# Patient Record
Sex: Female | Born: 1980 | Race: Black or African American | Hispanic: No | Marital: Single | Smoking: Former smoker
Health system: Southern US, Community
[De-identification: ages and names within clinical notes are randomized; demographics above are authoritative.]

## PROBLEM LIST (undated history)

## (undated) ENCOUNTER — Inpatient Hospital Stay (HOSPITAL_COMMUNITY): Payer: Self-pay

## (undated) DIAGNOSIS — I2510 Atherosclerotic heart disease of native coronary artery without angina pectoris: Secondary | ICD-10-CM

## (undated) DIAGNOSIS — Z720 Tobacco use: Secondary | ICD-10-CM

## (undated) DIAGNOSIS — D473 Essential (hemorrhagic) thrombocythemia: Secondary | ICD-10-CM

## (undated) DIAGNOSIS — Z5189 Encounter for other specified aftercare: Secondary | ICD-10-CM

## (undated) DIAGNOSIS — I214 Non-ST elevation (NSTEMI) myocardial infarction: Secondary | ICD-10-CM

## (undated) DIAGNOSIS — N920 Excessive and frequent menstruation with regular cycle: Secondary | ICD-10-CM

## (undated) DIAGNOSIS — K219 Gastro-esophageal reflux disease without esophagitis: Secondary | ICD-10-CM

## (undated) DIAGNOSIS — E669 Obesity, unspecified: Secondary | ICD-10-CM

## (undated) DIAGNOSIS — R110 Nausea: Secondary | ICD-10-CM

---

## 2000-03-20 ENCOUNTER — Emergency Department (HOSPITAL_COMMUNITY): Admission: EM | Admit: 2000-03-20 | Discharge: 2000-03-20 | Payer: Self-pay | Admitting: Emergency Medicine

## 2003-03-16 ENCOUNTER — Emergency Department (HOSPITAL_COMMUNITY): Admission: AD | Admit: 2003-03-16 | Discharge: 2003-03-17 | Payer: Self-pay | Admitting: Emergency Medicine

## 2004-02-21 ENCOUNTER — Emergency Department (HOSPITAL_COMMUNITY): Admission: EM | Admit: 2004-02-21 | Discharge: 2004-02-21 | Payer: Self-pay | Admitting: Emergency Medicine

## 2004-11-24 ENCOUNTER — Inpatient Hospital Stay (HOSPITAL_COMMUNITY): Admission: AD | Admit: 2004-11-24 | Discharge: 2004-11-24 | Payer: Self-pay | Admitting: *Deleted

## 2004-12-16 ENCOUNTER — Other Ambulatory Visit: Admission: RE | Admit: 2004-12-16 | Discharge: 2004-12-16 | Payer: Self-pay | Admitting: Obstetrics and Gynecology

## 2004-12-23 ENCOUNTER — Inpatient Hospital Stay (HOSPITAL_COMMUNITY): Admission: AD | Admit: 2004-12-23 | Discharge: 2004-12-23 | Payer: Self-pay | Admitting: Obstetrics and Gynecology

## 2005-01-17 ENCOUNTER — Inpatient Hospital Stay (HOSPITAL_COMMUNITY): Admission: AD | Admit: 2005-01-17 | Discharge: 2005-01-17 | Payer: Self-pay | Admitting: Obstetrics and Gynecology

## 2005-04-15 ENCOUNTER — Ambulatory Visit (HOSPITAL_COMMUNITY): Admission: RE | Admit: 2005-04-15 | Discharge: 2005-04-15 | Payer: Self-pay | Admitting: Obstetrics and Gynecology

## 2005-06-14 ENCOUNTER — Encounter: Admission: RE | Admit: 2005-06-14 | Discharge: 2005-06-14 | Payer: Self-pay | Admitting: Obstetrics and Gynecology

## 2005-07-07 ENCOUNTER — Ambulatory Visit (HOSPITAL_COMMUNITY): Admission: RE | Admit: 2005-07-07 | Discharge: 2005-07-07 | Payer: Self-pay | Admitting: Obstetrics and Gynecology

## 2005-07-12 ENCOUNTER — Inpatient Hospital Stay (HOSPITAL_COMMUNITY): Admission: RE | Admit: 2005-07-12 | Discharge: 2005-07-12 | Payer: Self-pay | Admitting: Obstetrics and Gynecology

## 2005-07-13 ENCOUNTER — Inpatient Hospital Stay (HOSPITAL_COMMUNITY): Admission: AD | Admit: 2005-07-13 | Discharge: 2005-07-16 | Payer: Self-pay | Admitting: Obstetrics and Gynecology

## 2005-08-23 ENCOUNTER — Inpatient Hospital Stay (HOSPITAL_COMMUNITY): Admission: AD | Admit: 2005-08-23 | Discharge: 2005-08-24 | Payer: Self-pay | Admitting: Obstetrics and Gynecology

## 2005-08-26 ENCOUNTER — Other Ambulatory Visit: Admission: RE | Admit: 2005-08-26 | Discharge: 2005-08-26 | Payer: Self-pay | Admitting: Obstetrics and Gynecology

## 2006-05-26 IMAGING — US US PELVIS COMPLETE MODIFY
1 series · 14 of 25 positions shown · non-contrast
Comparison: none

CLINICAL DATA: 24-year-old female.  Status post C-section 07/13/05 with vaginal bleeding.  
 TRANSABDOMINAL AND TRANSVAGINAL PELVIC ULTRASOUND:
TECHNIQUE: Both transabdominal and transvaginal ultrasound examinations of the pelvis were performed including evaluation of the uterus, ovaries, adnexal regions, and pelvic cul-de-sac.

[Series 1: us pelvis complete modify · 0.33mm/px · 14 of 59 slices shown]
[im 1/59]
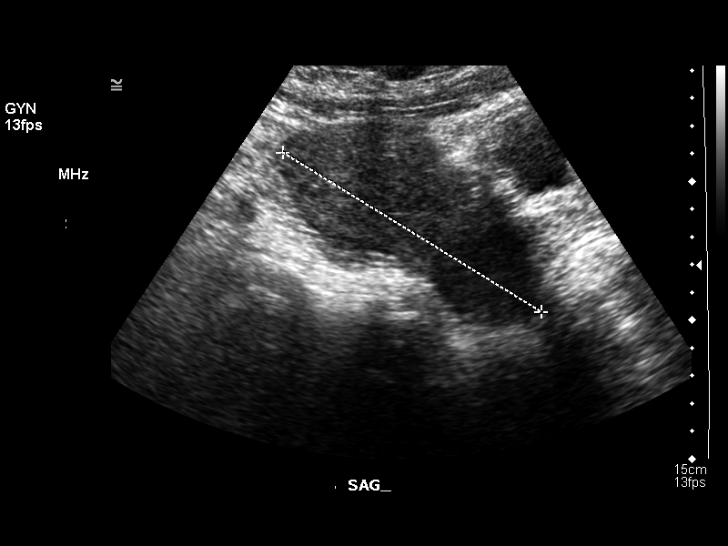
[im 5/59]
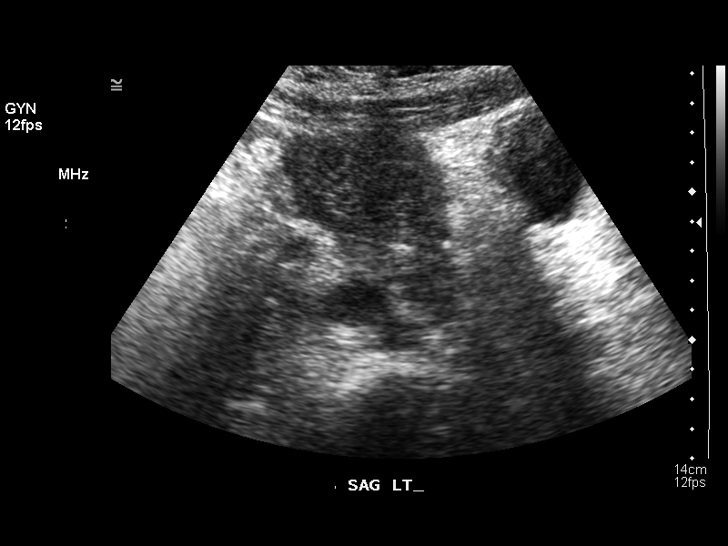
[im 10/59]
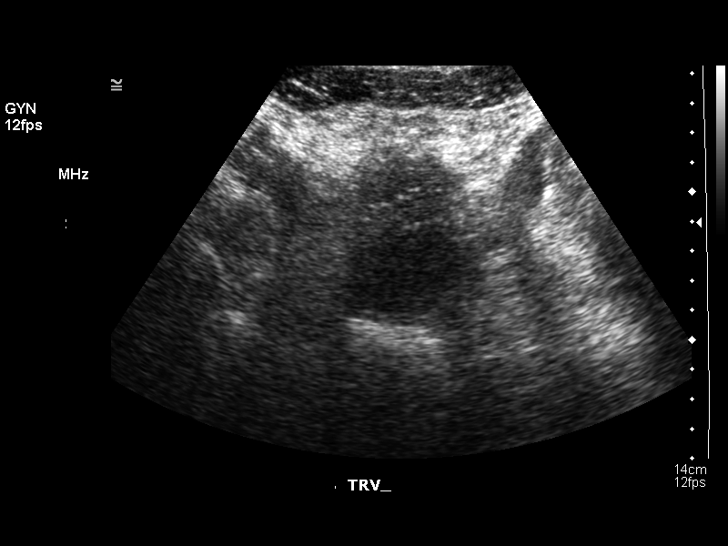
[im 15/59]
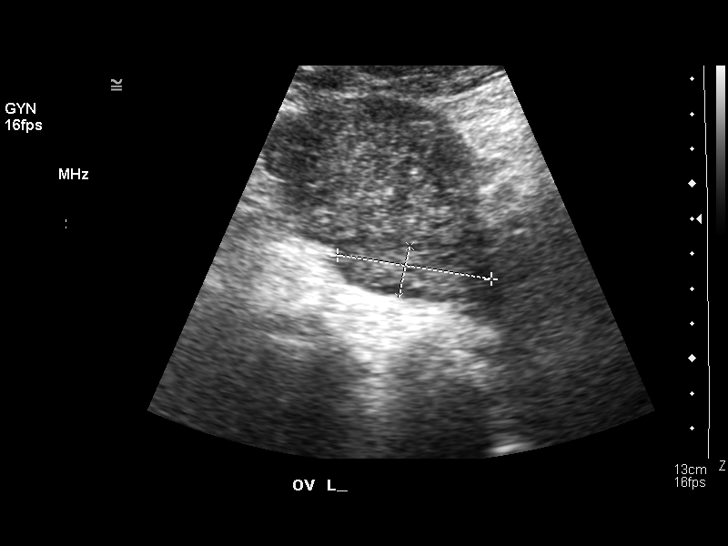
[im 20/59]
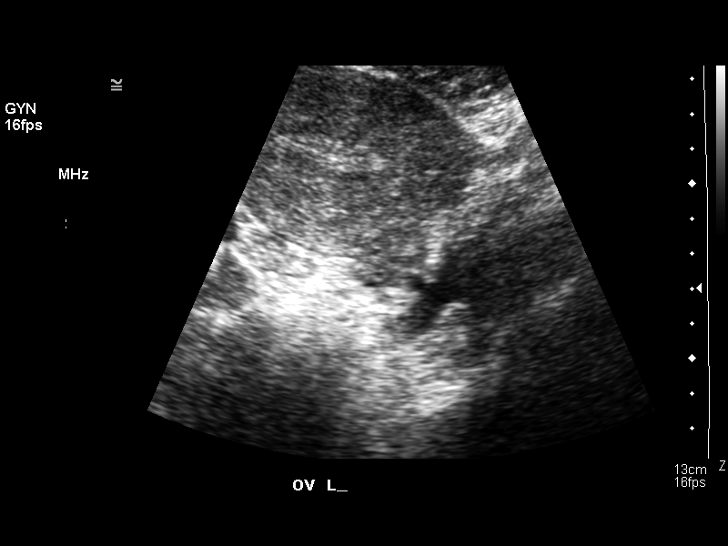
[im 22/59]
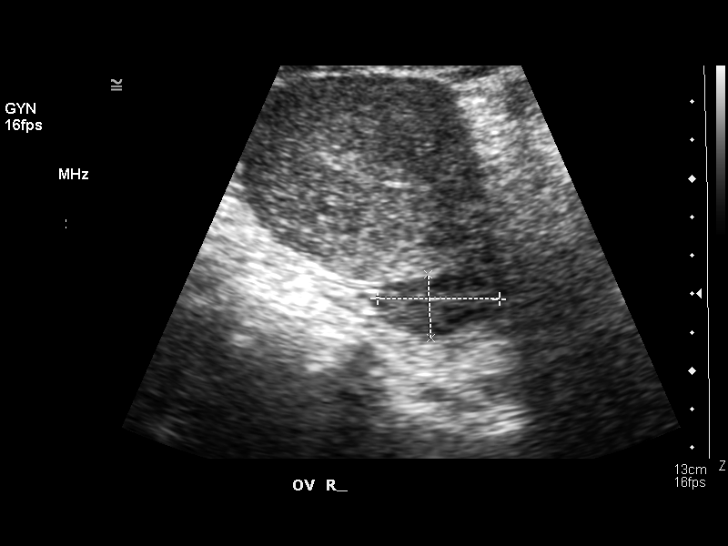
[im 27/59]
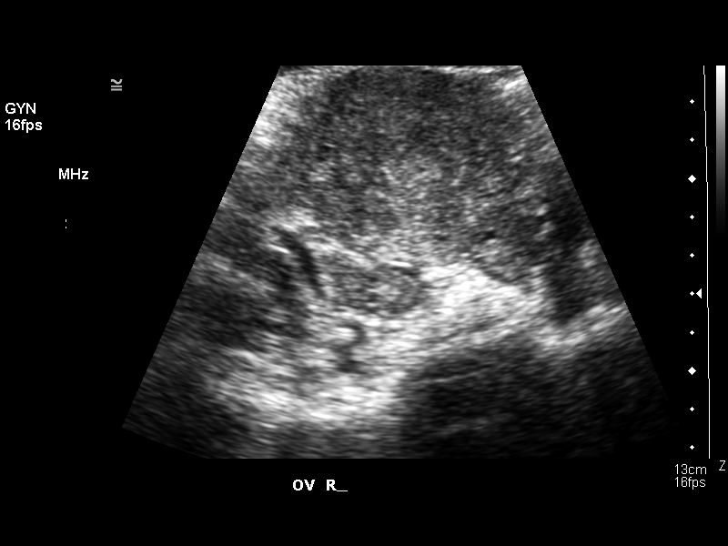
[im 32/59]
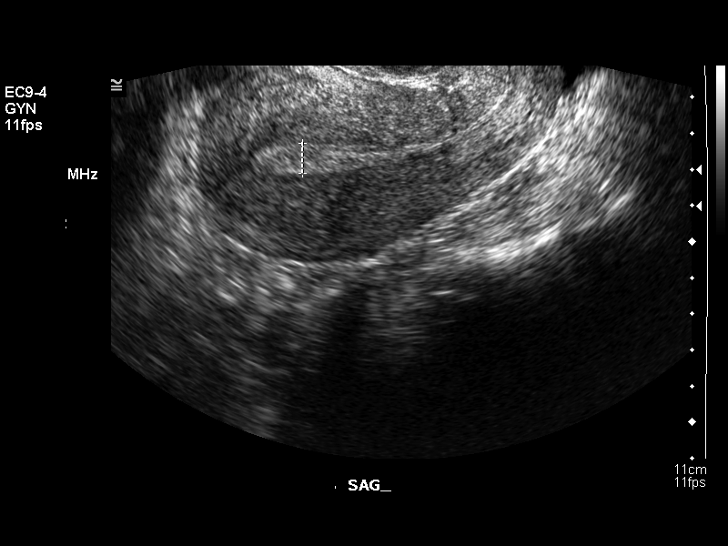
[im 37/59]
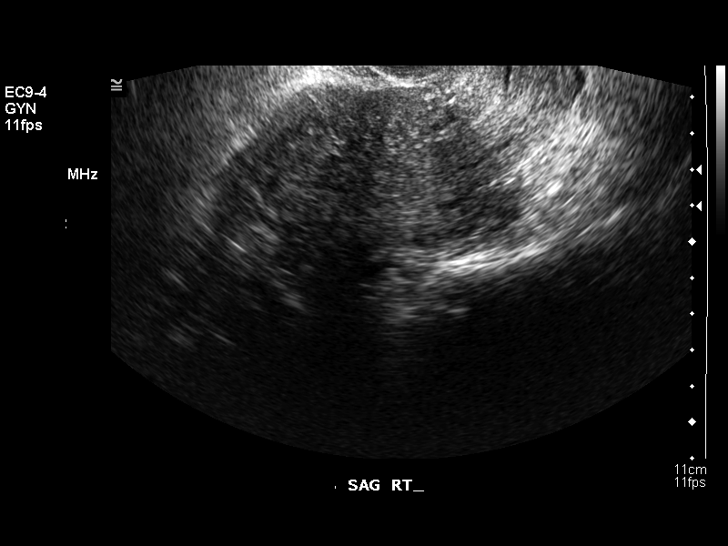
[im 39/59]
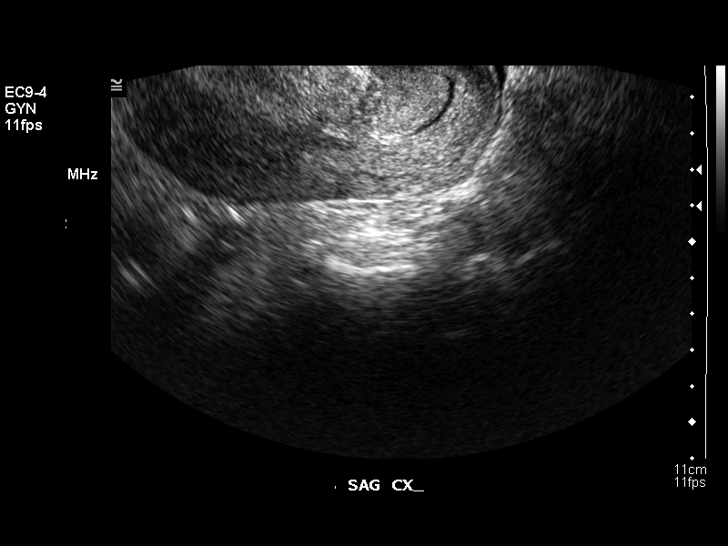
[im 44/59]
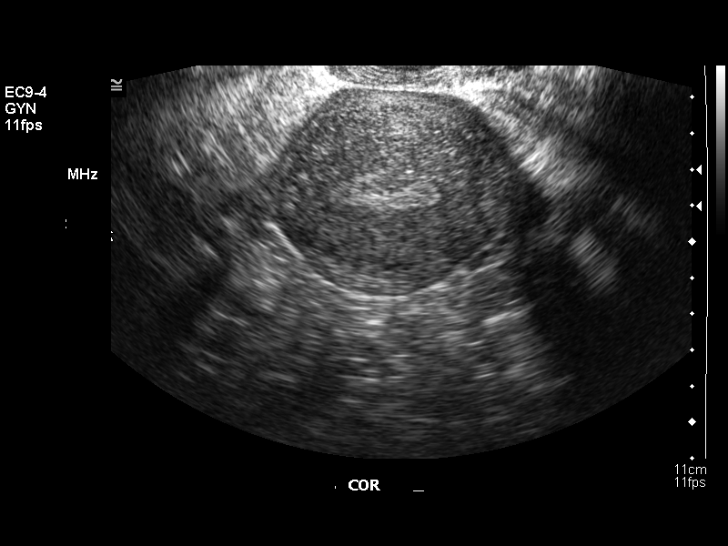
[im 49/59]
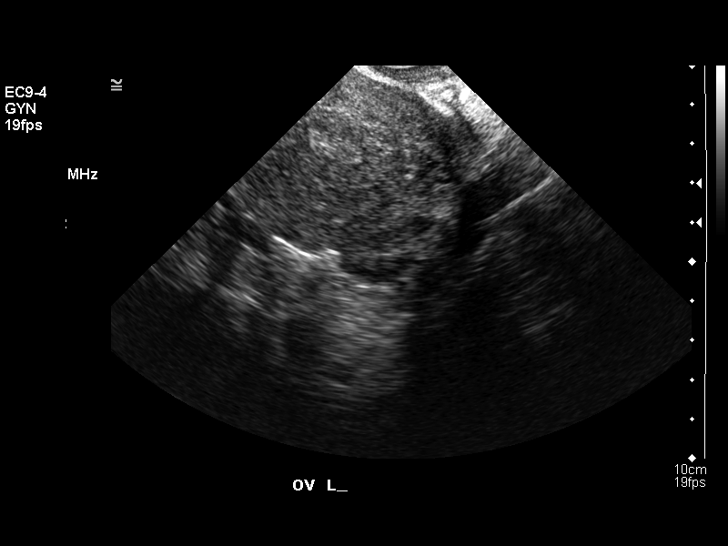
[im 54/59]
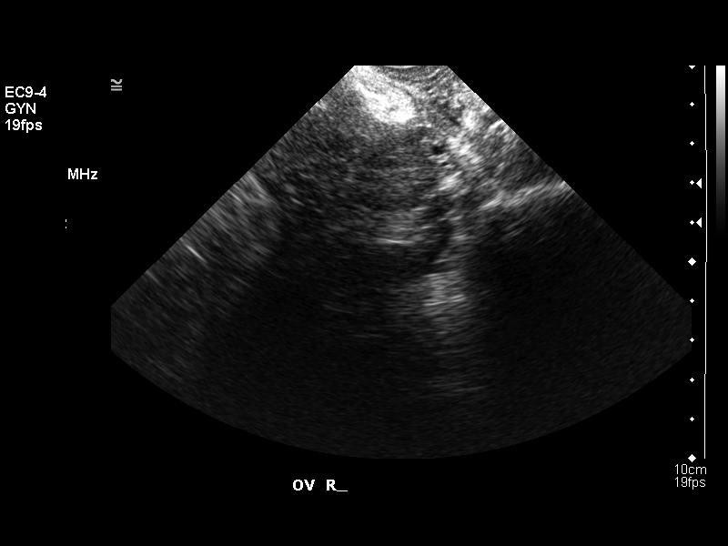
[im 59/59]
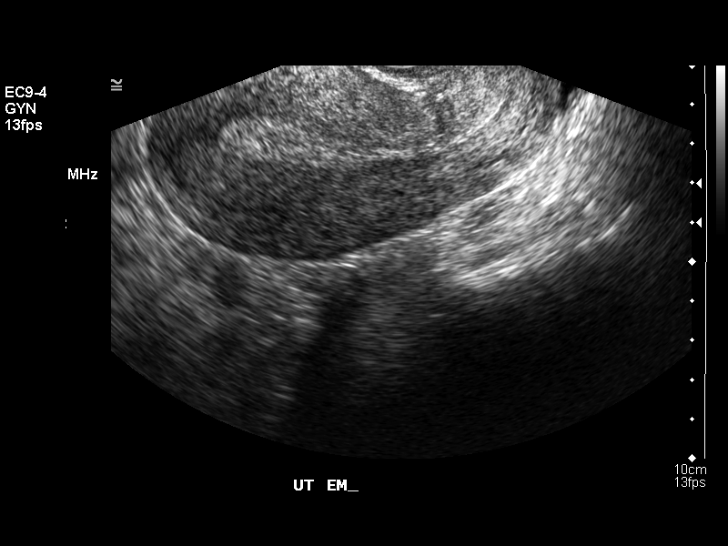

[14 of 25 positions shown; findings below may reference images not displayed]

FINDINGS: Uterus is normal in appearance measuring 10.7 x 5.6 x 6.7 cm.  Endometrium is uniform without irregularity and has a thickness of 8.3 mm.  The right ovary measures 3.3 x 1.7 x 1.5 cm.  Left ovary measures 4.8 x 1.3 x 2.2 cm.  No free fluid.
IMPRESSION: No acute finding.

## 2010-11-03 ENCOUNTER — Emergency Department (HOSPITAL_COMMUNITY)
Admission: EM | Admit: 2010-11-03 | Discharge: 2010-11-03 | Disposition: A | Discharge status: Home / Self Care | Payer: Self-pay | Attending: Emergency Medicine | Admitting: Emergency Medicine

## 2010-11-03 DIAGNOSIS — B353 Tinea pedis: Secondary | ICD-10-CM | POA: Insufficient documentation

## 2010-11-03 DIAGNOSIS — M79609 Pain in unspecified limb: Secondary | ICD-10-CM | POA: Insufficient documentation

## 2011-09-22 ENCOUNTER — Encounter (HOSPITAL_COMMUNITY): Payer: Self-pay | Admitting: *Deleted

## 2011-09-22 ENCOUNTER — Emergency Department (HOSPITAL_COMMUNITY)
Admission: EM | Admit: 2011-09-22 | Discharge: 2011-09-22 | Disposition: A | Discharge status: Home / Self Care | Payer: Self-pay | Attending: Emergency Medicine | Admitting: Emergency Medicine

## 2011-09-22 DIAGNOSIS — F17200 Nicotine dependence, unspecified, uncomplicated: Secondary | ICD-10-CM | POA: Insufficient documentation

## 2011-09-22 DIAGNOSIS — R51 Headache: Secondary | ICD-10-CM

## 2011-09-22 DIAGNOSIS — G43909 Migraine, unspecified, not intractable, without status migrainosus: Secondary | ICD-10-CM | POA: Insufficient documentation

## 2011-09-22 DIAGNOSIS — H538 Other visual disturbances: Secondary | ICD-10-CM | POA: Insufficient documentation

## 2011-09-22 DIAGNOSIS — R350 Frequency of micturition: Secondary | ICD-10-CM | POA: Insufficient documentation

## 2011-09-22 LAB — POCT I-STAT, CHEM 8
Calcium, Ion: 1.26 mmol/L (ref 1.12–1.32)
Hemoglobin: 9.2 g/dL — ABNORMAL LOW (ref 12.0–15.0)
Sodium: 141 mEq/L (ref 135–145)
TCO2: 24 mmol/L (ref 0–100)

## 2011-09-22 LAB — GLUCOSE, CAPILLARY: Glucose-Capillary: 92 mg/dL (ref 70–99)

## 2011-09-22 LAB — URINALYSIS, ROUTINE W REFLEX MICROSCOPIC
Nitrite: NEGATIVE
Specific Gravity, Urine: 1.021 (ref 1.005–1.030)
Urobilinogen, UA: 0.2 mg/dL (ref 0.0–1.0)

## 2011-09-22 LAB — PREGNANCY, URINE: Preg Test, Ur: NEGATIVE

## 2011-09-22 MED ORDER — OXYCODONE-ACETAMINOPHEN 5-325 MG PO TABS: 1.0000 | ORAL_TABLET | ORAL | Status: AC | PRN

## 2011-09-22 NOTE — ED Notes (Signed)
Pt here from home, c/o migraine x3 weeks. Hx of migraines, pt takes [facility] powder for management. States migraine has not improved, vision became blurred on 6/2, and "shift" of vision 6/4, per pt. States there is a "crawling" sensation in back of head that began on 6/2 as well

## 2011-09-22 NOTE — Discharge Instructions (Signed)
Return here or see the neurologist as needed for problems.    Headache, General, Unknown Cause The specific cause of your headache may not have been found today. There are many causes and types of headache. A few common ones are:  Tension headache.   Migraine.   Infections (examples: dental and sinus infections).   Bone and/or joint problems in the neck or jaw.   Depression.   Eye problems.  These headaches are not life threatening.  Headaches can sometimes be diagnosed by a patient history and a physical exam. Sometimes, lab and imaging studies (such as x-ray and/or CT scan) are used to rule out more serious problems. In some cases, a spinal tap (lumbar puncture) may be requested. There are many times when your exam and tests may be normal on the first visit even when there is a serious problem causing your headaches. Because of that, it is very important to follow up with your doctor or local clinic for further evaluation. FINDING OUT THE RESULTS OF TESTS  If a radiology test was performed, a radiologist will review your results.   You will be contacted by the emergency department or your physician if any test results require a change in your treatment plan.   Not all test results may be available during your visit. If your test results are not back during the visit, make an appointment with your caregiver to find out the results. Do not assume everything is normal if you have not heard from your caregiver or the medical facility. It is important for you to follow up on all of your test results.  HOME CARE INSTRUCTIONS   Keep follow-up appointments with your caregiver, or any specialist referral.   Only take over-the-counter or prescription medicines for pain, discomfort, or fever as directed by your caregiver.   Biofeedback, massage, or other relaxation techniques may be helpful.   Ice packs or heat applied to the head and neck can be used. Do this three to four times per day,  or as needed.   Call your doctor if you have any questions or concerns.   If you smoke, you should quit.  SEEK MEDICAL CARE IF:   You develop problems with medications prescribed.   You do not respond to or obtain relief from medications.   You have a change from the usual headache.   You develop nausea or vomiting.  SEEK IMMEDIATE MEDICAL CARE IF:   If your headache becomes severe.   You have an unexplained oral temperature above 102 F (38.9 C), or as your caregiver suggests.   You have a stiff neck.   You have loss of vision.   You have muscular weakness.   You have loss of muscular control.   You develop severe symptoms different from your first symptoms.   You start losing your balance or have trouble walking.   You feel faint or pass out.  MAKE SURE YOU:   Understand these instructions.   Will watch your condition.   Will get help right away if you are not doing well or get worse.  Document Released: 04/05/2005 Document Revised: 03/25/2011 Document Reviewed: 11/23/2007 [facility] Patient Information 2012 [place], [place].

## 2011-09-22 NOTE — ED Provider Notes (Signed)
History     CSN: [id number]  Arrival date & time 09/22/11  1514   First MD Initiated Contact with Patient 09/22/11 1618      Chief Complaint  Patient presents with  . Migraine  . Blurred Vision    (Consider location/radiation/quality/duration/timing/severity/associated sxs/prior treatment) HPI Comments: SHER SHAMPINE is a 31 y.o. Female who has had a persistent headache for 3 weeks. She has history of migraines. She is using BC powder without relief. She denies neck pain, weakness, dizziness, nausea, or vomiting. She has had urinary frequency, but no dysuria, or urinary incontinence. Symptoms are mild. She's able to continue working, driving, and sleeping.   Patient is a 31 y.o. female presenting with migraine. The history is provided by the patient.  Migraine    History reviewed. No pertinent past medical history.  Past Surgical History  Procedure Date  . Cesarean section     No family history on file.  History  Substance Use Topics  . Smoking status: Current Some Day Smoker -- 12 years    Types: Cigarettes  . Smokeless tobacco: Not on file  . Alcohol Use: No    OB History    Grav Para Term Preterm Abortions TAB SAB Ect Mult Living                  Review of Systems  All other systems reviewed and are negative.    Allergies  Review of patient's allergies indicates no known allergies.  Home Medications   Current Outpatient Rx  Name Route Sig Dispense Refill  . BC HEADACHE POWDER PO Oral Take 1 Package by mouth.    . OXYCODONE-ACETAMINOPHEN 5-325 MG PO TABS Oral Take 1 tablet by mouth every 4 (four) hours as needed for pain. 15 tablet 0    BP 131/89  Pulse 90  Temp(Src) 98.6 F (37 C) (Oral)  Resp 16  SpO2 100%  LMP 09/05/2011  Physical Exam  Nursing note and vitals reviewed. Constitutional: She is oriented to person, place, and time. She appears well-developed and well-nourished. No distress.  HENT:  Head: Normocephalic and atraumatic.    Eyes: Conjunctivae and EOM are normal. Pupils are equal, round, and reactive to light.  Neck: Normal range of motion and phonation normal. Neck supple. No tracheal deviation present.  Cardiovascular: Normal rate, regular rhythm and intact distal pulses.   Pulmonary/Chest: Effort normal and breath sounds normal. She exhibits no tenderness.  Abdominal: Soft. She exhibits no distension. There is no tenderness. There is no guarding.  Musculoskeletal: Normal range of motion.  Neurological: She is alert and oriented to person, place, and time. She has normal strength. She exhibits normal muscle tone.  Skin: Skin is warm and dry.  Psychiatric: She has a normal mood and affect. Her behavior is normal. Judgment and thought content normal.    ED Course  Procedures (including critical care time)  Labs Reviewed  POCT I-STAT, CHEM 8 - Abnormal; Notable for the following:    Glucose, Bld 110 (*)    Hemoglobin 9.2 (*)    HCT 27.0 (*)    All other components within normal limits  GLUCOSE, CAPILLARY  URINALYSIS, ROUTINE W REFLEX MICROSCOPIC  PREGNANCY, URINE   No results found.   1. Headache       MDM  Nonspecific headache. Doubt CVA, brain tumor, meningitis, sinusitis.Doubt metabolic instability, serious bacterial infection or impending vascular collapse; the patient is stable for discharge.  Plan: Home Medications- Percocet; Home Treatments- rest; Recommended follow  up- PCP or Neurology prn        Flint Melter, MD 09/22/11 2356

## 2011-09-22 NOTE — ED Notes (Signed)
Gave patient a sandwich and a cran grape juice.

## 2011-09-22 NOTE — ED Notes (Signed)
Pt has asked to go to her car.  It was explained that she is still a pt here and should not leave the department d/t safety issues.  Pt refused to comply saying she needs to get her school books to study.  She stated she understood this is AMA but still wants to be treated.  "I need to get some studying done and I'm sorry if you don't understand but I'm going to my car" and she walked off.

## 2013-09-20 ENCOUNTER — Ambulatory Visit: Payer: Medicaid Other | Admitting: Dietician

## 2013-12-22 ENCOUNTER — Encounter (HOSPITAL_COMMUNITY): Payer: Self-pay | Admitting: Emergency Medicine

## 2013-12-22 ENCOUNTER — Emergency Department (HOSPITAL_COMMUNITY)
Admission: EM | Admit: 2013-12-22 | Discharge: 2013-12-22 | Disposition: A | Discharge status: Home / Self Care | Payer: Medicaid Other | Attending: Emergency Medicine | Admitting: Emergency Medicine

## 2013-12-22 DIAGNOSIS — J069 Acute upper respiratory infection, unspecified: Secondary | ICD-10-CM | POA: Insufficient documentation

## 2013-12-22 DIAGNOSIS — F17200 Nicotine dependence, unspecified, uncomplicated: Secondary | ICD-10-CM | POA: Insufficient documentation

## 2013-12-22 DIAGNOSIS — J029 Acute pharyngitis, unspecified: Secondary | ICD-10-CM | POA: Insufficient documentation

## 2013-12-22 LAB — RAPID STREP SCREEN (MED CTR MEBANE ONLY): Streptococcus, Group A Screen (Direct): NEGATIVE

## 2013-12-22 MED ORDER — IBUPROFEN 600 MG PO TABS: 600.0000 mg | ORAL_TABLET | Freq: Four times a day (QID) | ORAL | Status: DC | PRN

## 2013-12-22 NOTE — ED Notes (Addendum)
Pt reports having a sore throat and a runny nose for two days. Pt states that her son was diagnosed with strep throat recently and she believes that she caught strep throat from him.

## 2013-12-22 NOTE — ED Provider Notes (Signed)
Medical screening examination/treatment/procedure(s) were performed by non-physician practitioner and as supervising physician I was immediately available for consultation/collaboration.   EKG Interpretation None       Threasa Beards, MD 12/22/13 2137

## 2013-12-22 NOTE — ED Provider Notes (Signed)
CSN: [id number]     Arrival date & time 12/22/13  2014 History   First MD Initiated Contact with Patient 12/22/13 2016    This chart was scribed for nurse practitioner Junius Creamer, NP working with Threasa Beards, MD, by Thea Alken, ED Scribe. This patient was seen in room WTR7/WTR7 and the patient's care was started at 8:19 PM. Chief Complaint  Patient presents with  . Sore Throat   The history is provided by the patient. No language interpreter was used.   Terry Morgan is a 33 y.o. female who presents to the Emergency Department complaining of sore throat 1 day ago. Pt reports she has been drinking tea and lemon juice. Pt denies fever. She reports her son was diagnosed with strep 3 days ago.   History reviewed. No pertinent past medical history. Past Surgical History  Procedure Laterality Date  . Cesarean section     No family history on file. History  Substance Use Topics  . Smoking status: Current Every Day Smoker -- 1.00 packs/day for 12 years    Types: Cigarettes  . Smokeless tobacco: Not on file  . Alcohol Use: No   OB History   Grav Para Term Preterm Abortions TAB SAB Ect Mult Living                 Review of Systems  Constitutional: Negative for fever and chills.  HENT: Positive for sore throat.   Respiratory: Negative for cough.   Neurological: Negative for dizziness and headaches.  All other systems reviewed and are negative.  Allergies  Review of patient's allergies indicates no known allergies.  Home Medications   Prior to Admission medications   Medication Sig Start Date End Date Taking? Authorizing Provider  Aspirin-Salicylamide-Caffeine (BC HEADACHE POWDER PO) Take 1 Package by mouth.    Historical Provider, MD  ibuprofen (ADVIL,MOTRIN) 600 MG tablet Take 1 tablet (600 mg total) by mouth every 6 (six) hours as needed. 12/22/13   Garald Balding, NP   BP 113/68  Pulse 77  Temp(Src) 98.6 F (37 C) (Oral)  Resp 18  SpO2 99%  LMP 12/14/2013 Physical  Exam  Nursing note and vitals reviewed. Constitutional: She is oriented to person, place, and time. She appears well-developed and well-nourished. No distress.  HENT:  Head: Normocephalic and atraumatic.  Mouth/Throat: Uvula is midline. Uvula swelling ( mild) present. Posterior oropharyngeal erythema present. No oropharyngeal exudate.  Eyes: EOM are normal. Pupils are equal, round, and reactive to light.  Neck: Neck supple.  Cardiovascular: Normal rate.   Pulmonary/Chest: Effort normal.  Musculoskeletal: Normal range of motion.  Neurological: She is alert and oriented to person, place, and time.  Skin: Skin is warm and dry.  Psychiatric: She has a normal mood and affect. Her behavior is normal.    ED Course  Procedures (including critical care time) DIAGNOSTIC STUDIES: Oxygen Saturation is 99% on RA, normal by my interpretation.    COORDINATION OF CARE: 9:33 PM- Pt advised of plan for treatment and pt agrees.  Labs Review Labs Reviewed  RAPID STREP SCREEN  CULTURE, GROUP A STREP   Results for orders placed during the hospital encounter of 12/22/13  RAPID STREP SCREEN      Result Value Ref Range   Streptococcus, Group A Screen (Direct) NEGATIVE  NEGATIVE   Imaging Review No results found.   EKG Interpretation None      MDM   Final diagnoses:  URI (upper respiratory infection)  Pharyngitis  Strep test is negative I personally performed the services described in this documentation, which was scribed in my presence. The recorded information has been reviewed and is accurate.      Garald Balding, NP 12/22/13 2135

## 2013-12-22 NOTE — Discharge Instructions (Signed)
Your strep test is negative. °

## 2013-12-24 LAB — CULTURE, GROUP A STREP

## 2014-09-11 ENCOUNTER — Other Ambulatory Visit (HOSPITAL_COMMUNITY): Payer: Self-pay

## 2014-09-11 ENCOUNTER — Inpatient Hospital Stay (HOSPITAL_COMMUNITY)
Admission: EM | Admit: 2014-09-11 | Discharge: 2014-09-16 | DRG: 247 | Disposition: A | Discharge status: Home / Self Care | Payer: Medicaid Other | Attending: Cardiovascular Disease | Admitting: Cardiovascular Disease

## 2014-09-11 ENCOUNTER — Emergency Department (HOSPITAL_COMMUNITY): Payer: Medicaid Other

## 2014-09-11 ENCOUNTER — Encounter (HOSPITAL_COMMUNITY): Payer: Self-pay | Admitting: *Deleted

## 2014-09-11 DIAGNOSIS — R1310 Dysphagia, unspecified: Secondary | ICD-10-CM

## 2014-09-11 DIAGNOSIS — D75839 Thrombocytosis, unspecified: Secondary | ICD-10-CM | POA: Diagnosis present

## 2014-09-11 DIAGNOSIS — F17210 Nicotine dependence, cigarettes, uncomplicated: Secondary | ICD-10-CM | POA: Diagnosis present

## 2014-09-11 DIAGNOSIS — R112 Nausea with vomiting, unspecified: Secondary | ICD-10-CM | POA: Diagnosis present

## 2014-09-11 DIAGNOSIS — Z6841 Body Mass Index (BMI) 40.0 and over, adult: Secondary | ICD-10-CM

## 2014-09-11 DIAGNOSIS — E669 Obesity, unspecified: Secondary | ICD-10-CM | POA: Diagnosis present

## 2014-09-11 DIAGNOSIS — D649 Anemia, unspecified: Secondary | ICD-10-CM | POA: Diagnosis present

## 2014-09-11 DIAGNOSIS — Z720 Tobacco use: Secondary | ICD-10-CM | POA: Diagnosis present

## 2014-09-11 DIAGNOSIS — I2510 Atherosclerotic heart disease of native coronary artery without angina pectoris: Secondary | ICD-10-CM | POA: Diagnosis present

## 2014-09-11 DIAGNOSIS — K5900 Constipation, unspecified: Secondary | ICD-10-CM | POA: Diagnosis present

## 2014-09-11 DIAGNOSIS — D509 Iron deficiency anemia, unspecified: Secondary | ICD-10-CM | POA: Diagnosis present

## 2014-09-11 DIAGNOSIS — I2582 Chronic total occlusion of coronary artery: Secondary | ICD-10-CM | POA: Diagnosis present

## 2014-09-11 DIAGNOSIS — I214 Non-ST elevation (NSTEMI) myocardial infarction: Secondary | ICD-10-CM

## 2014-09-11 DIAGNOSIS — N920 Excessive and frequent menstruation with regular cycle: Secondary | ICD-10-CM | POA: Diagnosis present

## 2014-09-11 DIAGNOSIS — D473 Essential (hemorrhagic) thrombocythemia: Secondary | ICD-10-CM | POA: Diagnosis present

## 2014-09-11 DIAGNOSIS — R739 Hyperglycemia, unspecified: Secondary | ICD-10-CM | POA: Diagnosis present

## 2014-09-11 DIAGNOSIS — R079 Chest pain, unspecified: Secondary | ICD-10-CM | POA: Diagnosis present

## 2014-09-11 DIAGNOSIS — K219 Gastro-esophageal reflux disease without esophagitis: Secondary | ICD-10-CM | POA: Diagnosis present

## 2014-09-11 DIAGNOSIS — R001 Bradycardia, unspecified: Secondary | ICD-10-CM | POA: Diagnosis present

## 2014-09-11 DIAGNOSIS — R110 Nausea: Secondary | ICD-10-CM

## 2014-09-11 HISTORY — DX: Essential (hemorrhagic) thrombocythemia: D47.3

## 2014-09-11 HISTORY — DX: Tobacco use: Z72.0

## 2014-09-11 HISTORY — DX: Excessive and frequent menstruation with regular cycle: N92.0

## 2014-09-11 HISTORY — DX: Atherosclerotic heart disease of native coronary artery without angina pectoris: I25.10

## 2014-09-11 HISTORY — DX: Gastro-esophageal reflux disease without esophagitis: K21.9

## 2014-09-11 HISTORY — DX: Non-ST elevation (NSTEMI) myocardial infarction: I21.4

## 2014-09-11 HISTORY — DX: Encounter for other specified aftercare: Z51.89

## 2014-09-11 HISTORY — DX: Thrombocytosis, unspecified: D75.839

## 2014-09-11 HISTORY — DX: Nausea: R11.0

## 2014-09-11 HISTORY — DX: Obesity, unspecified: E66.9

## 2014-09-11 LAB — BRAIN NATRIURETIC PEPTIDE: B Natriuretic Peptide: 16.5 pg/mL (ref 0.0–100.0)

## 2014-09-11 LAB — CBC WITH DIFFERENTIAL/PLATELET
BASOS PCT: 1 % (ref 0–1)
Basophils Absolute: 0.1 10*3/uL (ref 0.0–0.1)
EOS PCT: 5 % (ref 0–5)
Eosinophils Absolute: 0.4 10*3/uL (ref 0.0–0.7)
HCT: 31.7 % — ABNORMAL LOW (ref 36.0–46.0)
HEMOGLOBIN: 9.4 g/dL — AB (ref 12.0–15.0)
LYMPHS PCT: 33 % (ref 12–46)
Lymphs Abs: 2.6 10*3/uL (ref 0.7–4.0)
MCH: 20.3 pg — ABNORMAL LOW (ref 26.0–34.0)
MCHC: 29.7 g/dL — AB (ref 30.0–36.0)
MCV: 68.5 fL — ABNORMAL LOW (ref 78.0–100.0)
MONO ABS: 0.6 10*3/uL (ref 0.1–1.0)
MONOS PCT: 8 % (ref 3–12)
NEUTROS PCT: 53 % (ref 43–77)
Neutro Abs: 4.3 10*3/uL (ref 1.7–7.7)
Platelets: 557 10*3/uL — ABNORMAL HIGH (ref 150–400)
RBC: 4.63 MIL/uL (ref 3.87–5.11)
RDW: 24.8 % — ABNORMAL HIGH (ref 11.5–15.5)
WBC: 8 10*3/uL (ref 4.0–10.5)

## 2014-09-11 LAB — BASIC METABOLIC PANEL
ANION GAP: 9 (ref 5–15)
BUN: 8 mg/dL (ref 6–20)
CO2: 22 mmol/L (ref 22–32)
CREATININE: 0.7 mg/dL (ref 0.44–1.00)
Calcium: 9.2 mg/dL (ref 8.9–10.3)
Chloride: 104 mmol/L (ref 101–111)
GFR calc Af Amer: >60 mL/min (ref >60)
Glucose, Bld: 100 mg/dL — ABNORMAL HIGH (ref 65–99)
POTASSIUM: 4 mmol/L (ref 3.5–5.1)
SODIUM: 135 mmol/L (ref 135–145)

## 2014-09-11 LAB — TROPONIN I: Troponin I: 1 ng/mL (ref <0.031)

## 2014-09-11 LAB — I-STAT BETA HCG BLOOD, ED (MC, WL, AP ONLY): I-stat hCG, quantitative: <5 m[IU]/mL (ref <5)

## 2014-09-11 MED ORDER — SODIUM CHLORIDE 0.9 % IV SOLN
Freq: Once | INTRAVENOUS | Status: AC
  Administered 2014-09-11: 20:00:00 via INTRAVENOUS

## 2014-09-11 MED ORDER — ONDANSETRON HCL 4 MG/2ML IJ SOLN
4.0000 mg | Freq: Once | INTRAMUSCULAR | Status: DC
  Filled 2014-09-11: qty 2

## 2014-09-11 MED ORDER — ASPIRIN 81 MG PO CHEW
324.0000 mg | CHEWABLE_TABLET | Freq: Once | ORAL | Status: AC
  Administered 2014-09-11: 324 mg via ORAL
  Filled 2014-09-11: qty 4

## 2014-09-11 MED ORDER — ONDANSETRON HCL 4 MG/2ML IJ SOLN
4.0000 mg | Freq: Once | INTRAMUSCULAR | Status: AC
  Administered 2014-09-11: 4 mg via INTRAVENOUS
  Filled 2014-09-11: qty 2

## 2014-09-11 MED ORDER — IOHEXOL 350 MG/ML SOLN
100.0000 mL | Freq: Once | INTRAVENOUS | Status: AC | PRN
  Administered 2014-09-11: 100 mL via INTRAVENOUS

## 2014-09-11 NOTE — ED Notes (Signed)
Cardiology at bedside.

## 2014-09-11 NOTE — ED Provider Notes (Signed)
CSN: [id number]     Arrival date & time 09/11/14  1829 History   First MD Initiated Contact with Patient 09/11/14 1910     Chief Complaint  Patient presents with  . Chest Pain     (Consider location/radiation/quality/duration/timing/severity/associated sxs/prior Treatment) HPI  Terry Morgan is a 34 y.o. female with PMH of smoking, obesity presenting with centralized chest pain intermittently for the past 2 months that described as sharp, tightness without radiation. It started at 1 PM today and resolved and returned at 4 PM and has been constant. Patient reports associated shortness of breath and nausea but no emesis or diaphoresis. Patient stated this is similar to when she had received a blood transfusion for anemia after cesarean. Patient denies any hematemesis or hematochezia. Patient does reports history of heavy bleeding saturating 1-2 pads per hour with 5-7 days menses every month. Patient denies history of hypertension, dyslipidemia, diabetes. No significant family history of cardiac problems. Patient never had stress test or cardiac catheterization. Pt denies history of DVT, PE, recent surgery or trauma, malignancy, hemoptysis, exogenous estrogen use, unilateral leg swelling or tenderness, immobilization.    Past Medical History  Diagnosis Date  . Blood transfusion without reported diagnosis    Past Surgical History  Procedure Laterality Date  . Cesarean section     History reviewed. No pertinent family history. History  Substance Use Topics  . Smoking status: Current Every Day Smoker -- 1.00 packs/day for 12 years    Types: Cigarettes  . Smokeless tobacco: Not on file  . Alcohol Use: No   OB History    No data available     Review of Systems 10 Systems reviewed and are negative for acute change except as noted in the HPI.    Allergies  Review of patient's allergies indicates no known allergies.  Home Medications   Prior to Admission medications   Medication  Sig Start Date End Date Taking? Authorizing Provider  ibuprofen (ADVIL,MOTRIN) 600 MG tablet Take 1 tablet (600 mg total) by mouth every 6 (six) hours as needed. Patient not taking: Reported on 09/11/2014 12/22/13   Junius Creamer, NP   BP 105/51 mmHg  Pulse 61  Temp(Src) 98.4 F (36.9 C)  Resp 22  SpO2 100%  LMP 09/04/2014 Physical Exam  Constitutional: She appears well-developed and well-nourished. No distress.  HENT:  Head: Normocephalic and atraumatic.  Eyes: Conjunctivae and EOM are normal. Right eye exhibits no discharge. Left eye exhibits no discharge.  Neck: No JVD present.  Cardiovascular: Normal rate and regular rhythm.   No leg swelling or tenderness. Negative Homan's sign.  Pulmonary/Chest: Effort normal and breath sounds normal. No respiratory distress. She has no wheezes.  Abdominal: Soft. Bowel sounds are normal. She exhibits no distension. There is no tenderness.  Neurological: She is alert. She exhibits normal muscle tone. Coordination normal.  Skin: Skin is warm and dry. She is not diaphoretic.  Nursing note and vitals reviewed.   ED Course  Procedures (including critical care time) Labs Review Labs Reviewed  TROPONIN I - Abnormal; Notable for the following:    Troponin I 1.00 (*)    All other components within normal limits  BASIC METABOLIC PANEL - Abnormal; Notable for the following:    Glucose, Bld 100 (*)    All other components within normal limits  CBC WITH DIFFERENTIAL/PLATELET - Abnormal; Notable for the following:    Hemoglobin 9.4 (*)    HCT 31.7 (*)    MCV 68.5 (*)  MCH 20.3 (*)    MCHC 29.7 (*)    RDW 24.8 (*)    Platelets 557 (*)    All other components within normal limits  BRAIN NATRIURETIC PEPTIDE  I-STAT BETA HCG BLOOD, ED ([facility], [facility], [facility] ONLY)    Imaging Review Dg Chest 2 View  09/11/2014   CLINICAL DATA:  Centralized chest pain shortness of breath for 1 week. History of smoking.  EXAM: CHEST  2 VIEW  COMPARISON:  None.  FINDINGS:  Borderline enlarged cardiac silhouette. Normal mediastinal contours. Minimal bilateral infrahilar heterogeneous opacities favored to represent atelectasis. No discrete focal airspace opacities. There is mild diffuse slightly nodular thickening of the pulmonary interstitium. There is minimal pleural parenchymal thickening about the right minor fissure. No pleural effusion or pneumothorax. No evidence of edema. No acute osseus abnormalities.  IMPRESSION: Findings suggestive of airways disease / bronchitis. No focal airspace opacities to suggest pneumonia.   Electronically Signed   By: Sandi Mariscal M.D.   On: 09/11/2014 20:42   Ct Angio Chest Pe W/cm &/or Wo Cm  09/11/2014   CLINICAL DATA:  Initial UA shin for acute chest tightness, shortness of breath.  EXAM: CT ANGIOGRAPHY CHEST WITH CONTRAST  TECHNIQUE: Multidetector CT imaging of the chest was performed using the standard protocol during bolus administration of intravenous contrast. Multiplanar CT image reconstructions and MIPs were obtained to evaluate the vascular anatomy.  CONTRAST:  152mL OMNIPAQUE IOHEXOL 350 MG/ML SOLN  COMPARISON:  Prior radiograph from earlier the same day  FINDINGS: Visualized thyroid gland is within normal limits. Left thyroid lobe is extends inferiorly towards the substernal region. No pathologically enlarged mediastinal, hilar, or axillary lymph nodes identified.  Intrathoracic aorta of normal caliber and appearance. Great vessels within normal limits.  Heart size normal.  No pericardial effusion.  Pulmonary arterial tree is adequately opacified for evaluation. No filling defect to suggest acute pulmonary embolism identified. Re-formatted imaging confirms these findings.  Lungs are clear without focal infiltrate or pulmonary edema. No pleural effusion. No pneumothorax. No worrisome pulmonary nodule or mass. Mild subsegmental atelectasis seen dependently within the lower lobes bilaterally.  Visualized portions of the upper abdomen are  unremarkable.  No acute osseous abnormality. No worrisome lytic or blastic osseous lesions.  IMPRESSION: 1. No CT evidence for acute pulmonary embolism. 2. No other acute cardiopulmonary process identified.   Electronically Signed   By: Jeannine Boga M.D.   On: 09/11/2014 22:37     EKG Interpretation None       ED ECG REPORT   Date: 09/11/2014  Rate: 60  Rhythm: normal sinus rhythm  QRS Axis: normal  Intervals: PR interval 124, QT 412  ST/T Wave abnormalities: nonspecific T wave changes  Conduction Disutrbances:none  Narrative Interpretation: NSR and nonspecific T wave abnormality  Old EKG Reviewed: none available  I have personally reviewed the EKG tracing and agree with the computerized printout as noted.   Meds given in ED:  Medications  ondansetron (ZOFRAN) injection 4 mg (4 mg Intravenous Not Given 09/11/14 2312)  aspirin chewable tablet 324 mg (324 mg Oral Given 09/11/14 2006)  ondansetron (ZOFRAN) injection 4 mg (4 mg Intravenous Given 09/11/14 1945)  0.9 %  sodium chloride infusion ( Intravenous Stopped 09/11/14 2312)  iohexol (OMNIPAQUE) 350 MG/ML injection 100 mL (100 mLs Intravenous Contrast Given 09/11/14 2158)    New Prescriptions   No medications on file      MDM   Final diagnoses:  NSTEMI (non-ST elevated myocardial infarction)  Patient presenting with intermittent chest pain that became constant today at 4 PM with associated shortness of breath. Patient with initial hypotension and bradycardia. Troponin of 1. CTA ordered to evaluate for PE was negative. No other acute cardiopulmonary process identified. Concern for an NSTEMI. Consult to Cardiology. Spoke with Dr. Jules Husbands who agrees to evaluate the pt with plan for admission for further ACS workup.  Discussed return precautions with patient. Discussed all results and patient verbalizes understanding and agrees with plan.  This is a shared patient. This patient was discussed with the physician who  saw and evaluated the patient and agrees with the plan.    Al Corpus, PA-C 09/13/14 1129  Charlesetta Shanks, MD 09/14/14 [phone]

## 2014-09-11 NOTE — H&P (Signed)
Terry Morgan is an 34 y.o. female.    Chief Complaint: chest pain Primary Cardiologist: new HPI: [name] is a 34 yo woman with PMH of heavy menses requiring transfusion previously, tobacco use ~ 1ppd x > 12 years, obesity who presents with chest pain. She states the pain has been intermittent for the past 2 months with more frequently episodes over the last week and she characterizes the pain as substernal with sharp and tight components. She does endorse some associated shortness of breath and mild nausea. She has had similar symptoms before requiring transfusion when she had a caesarean section. She tells me about a year ago she took phentermine for weight loss and had some chest pain after taking the medication - she stopped it after approximately 2 weeks - this was prescribed by her PCP. She has stable heavy menses ~ once per month lasting 7-8 days. She had mildly low blood pressure on arrival but poor PO intake and some nausea earlier today. She denies recent bleeding or sick contacts. No trauma, no falls. She has passed out previously. She says she's been told she's had a heart murmur but no echocardiogram performed. She is chest pain free when I evaluate her. No recent trauma. In the ER she had an elevated troponin and CTA was performed and negative for PE.     Past Medical History  Diagnosis Date  . Blood transfusion without reported diagnosis     Past Surgical History  Procedure Laterality Date  . Cesarean section      History reviewed. No pertinent family history. Social History:  reports that she has been smoking Cigarettes.  She has a 12 pack-year smoking history. She does not have any smokeless tobacco history on file. She reports that she does not drink alcohol or use illicit drugs.  Allergies: No Known Allergies   (Not in a hospital admission)  Results for orders placed or performed during the hospital encounter of 09/11/14 (from the past 48 hour(s))  Troponin  I     Status: Abnormal   Collection Time: 09/11/14  6:50 PM  Result Value Ref Range   Troponin I 1.00 (HH) <0.031 ng/mL    Comment:        POSSIBLE MYOCARDIAL ISCHEMIA. SERIAL TESTING RECOMMENDED. REPEATED TO VERIFY CRITICAL RESULT CALLED TO, READ BACK BY AND VERIFIED WITH: [facility] 2002 09/11/2014 WBOND   Basic metabolic panel     Status: Abnormal   Collection Time: 09/11/14  6:50 PM  Result Value Ref Range   Sodium 135 135 - 145 mmol/L   Potassium 4.0 3.5 - 5.1 mmol/L   Chloride 104 101 - 111 mmol/L   CO2 22 22 - 32 mmol/L   Glucose, Bld 100 (H) 65 - 99 mg/dL   BUN 8 6 - 20 mg/dL   Creatinine, Ser 0.70 0.44 - 1.00 mg/dL   Calcium 9.2 8.9 - 10.3 mg/dL   GFR calc non Af Amer >60 >60 mL/min   GFR calc Af Amer >60 >60 mL/min    Comment: (NOTE) The eGFR has been calculated using the CKD EPI equation. This calculation has not been validated in all clinical situations. eGFR's persistently <60 mL/min signify possible Chronic Kidney Disease.    Anion gap 9 5 - 15  BNP (order ONLY if patient complains of dyspnea/SOB AND you have documented it for THIS visit)     Status: None   Collection Time: 09/11/14  6:51 PM  Result Value Ref Range  B Natriuretic Peptide 16.5 0.0 - 100.0 pg/mL  CBC with Differential     Status: Abnormal   Collection Time: 09/11/14  6:51 PM  Result Value Ref Range   WBC 8.0 4.0 - 10.5 K/uL   RBC 4.63 3.87 - 5.11 MIL/uL   Hemoglobin 9.4 (L) 12.0 - 15.0 g/dL   HCT 31.7 (L) 36.0 - 46.0 %   MCV 68.5 (L) 78.0 - 100.0 fL   MCH 20.3 (L) 26.0 - 34.0 pg   MCHC 29.7 (L) 30.0 - 36.0 g/dL   RDW 24.8 (H) 11.5 - 15.5 %   Platelets 557 (H) 150 - 400 K/uL   Neutrophils Relative % 53 43 - 77 %   Lymphocytes Relative 33 12 - 46 %   Monocytes Relative 8 3 - 12 %   Eosinophils Relative 5 0 - 5 %   Basophils Relative 1 0 - 1 %   Neutro Abs 4.3 1.7 - 7.7 K/uL   Lymphs Abs 2.6 0.7 - 4.0 K/uL   Monocytes Absolute 0.6 0.1 - 1.0 K/uL   Eosinophils Absolute 0.4 0.0 - 0.7  K/uL   Basophils Absolute 0.1 0.0 - 0.1 K/uL   RBC Morphology POLYCHROMASIA PRESENT    Smear Review LARGE PLATELETS PRESENT     Comment: PLATELET CLUMPS NOTED ON SMEAR, COUNT APPEARS INCREASED  I-Stat Beta hCG blood, ED ([facility], [facility], [facility] only)     Status: None   Collection Time: 09/11/14  8:58 PM  Result Value Ref Range   I-stat hCG, quantitative <5.0 <5 mIU/mL   Comment 3            Comment:   GEST. AGE      CONC.  (mIU/mL)   <=1 WEEK        5 - 50     2 WEEKS       50 - 500     3 WEEKS       100 - 10,000     4 WEEKS     1,000 - 30,000        FEMALE AND NON-PREGNANT FEMALE:     LESS THAN 5 mIU/mL    Dg Chest 2 View  09/11/2014   CLINICAL DATA:  Centralized chest pain shortness of breath for 1 week. History of smoking.  EXAM: CHEST  2 VIEW  COMPARISON:  None.  FINDINGS: Borderline enlarged cardiac silhouette. Normal mediastinal contours. Minimal bilateral infrahilar heterogeneous opacities favored to represent atelectasis. No discrete focal airspace opacities. There is mild diffuse slightly nodular thickening of the pulmonary interstitium. There is minimal pleural parenchymal thickening about the right minor fissure. No pleural effusion or pneumothorax. No evidence of edema. No acute osseus abnormalities.  IMPRESSION: Findings suggestive of airways disease / bronchitis. No focal airspace opacities to suggest pneumonia.   Electronically Signed   By: Sandi Mariscal M.D.   On: 09/11/2014 20:42   Ct Angio Chest Pe W/cm &/or Wo Cm  09/11/2014   CLINICAL DATA:  Initial UA shin for acute chest tightness, shortness of breath.  EXAM: CT ANGIOGRAPHY CHEST WITH CONTRAST  TECHNIQUE: Multidetector CT imaging of the chest was performed using the standard protocol during bolus administration of intravenous contrast. Multiplanar CT image reconstructions and MIPs were obtained to evaluate the vascular anatomy.  CONTRAST:  165m OMNIPAQUE IOHEXOL 350 MG/ML SOLN  COMPARISON:  Prior radiograph from earlier the same day   FINDINGS: Visualized thyroid gland is within normal limits. Left thyroid lobe is extends inferiorly towards the  substernal region. No pathologically enlarged mediastinal, hilar, or axillary lymph nodes identified.  Intrathoracic aorta of normal caliber and appearance. Great vessels within normal limits.  Heart size normal.  No pericardial effusion.  Pulmonary arterial tree is adequately opacified for evaluation. No filling defect to suggest acute pulmonary embolism identified. Re-formatted imaging confirms these findings.  Lungs are clear without focal infiltrate or pulmonary edema. No pleural effusion. No pneumothorax. No worrisome pulmonary nodule or mass. Mild subsegmental atelectasis seen dependently within the lower lobes bilaterally.  Visualized portions of the upper abdomen are unremarkable.  No acute osseous abnormality. No worrisome lytic or blastic osseous lesions.  IMPRESSION: 1. No CT evidence for acute pulmonary embolism. 2. No other acute cardiopulmonary process identified.   Electronically Signed   By: Jeannine Boga M.D.   On: 09/11/2014 22:37    Review of Systems  Constitutional: Negative for fever, chills and weight loss.  HENT: Negative for ear discharge.   Eyes: Negative for double vision and photophobia.  Respiratory: Negative for cough and hemoptysis.   Cardiovascular: Positive for chest pain, palpitations and leg swelling. Negative for orthopnea and claudication.  Gastrointestinal: Positive for nausea. Negative for abdominal pain and diarrhea.  Genitourinary: Negative for dysuria and hematuria.  Musculoskeletal: Negative for myalgias and neck pain.  Skin: Negative for rash.  Neurological: Negative for tingling, tremors, sensory change and headaches.  Endo/Heme/Allergies: Negative for polydipsia. Does not bruise/bleed easily.  Psychiatric/Behavioral: Negative for depression, suicidal ideas and hallucinations. The patient is not nervous/anxious.     Blood pressure 120/81,  pulse 67, temperature 98.4 F (36.9 C), resp. rate 22, last menstrual period 09/04/2014, SpO2 99 %. Physical Exam  Nursing note and vitals reviewed. Constitutional: She is oriented to person, place, and time. She appears well-developed and well-nourished. No distress.  HENT:  Head: Normocephalic and atraumatic.  Nose: Nose normal.  Mouth/Throat: Oropharynx is clear and moist. No oropharyngeal exudate.  Eyes: Conjunctivae and EOM are normal. Pupils are equal, Morgan, and reactive to light. No scleral icterus.  Neck: Normal range of motion. Neck supple. JVD present. No tracheal deviation present.  JVP midneck, slight HJR  Cardiovascular: Normal rate, regular rhythm, normal heart sounds and intact distal pulses.  Exam reveals no gallop.   No murmur heard. bradycardic  Respiratory: Effort normal and breath sounds normal. No respiratory distress. She has no wheezes. She has no rales.  GI: Soft. She exhibits no distension. There is no tenderness. There is no rebound.  Musculoskeletal: Normal range of motion. She exhibits no edema or tenderness.  Neurological: She is alert and oriented to person, place, and time. No cranial nerve deficit. Coordination normal.  Skin: Skin is warm and dry. No rash noted. She is not diaphoretic. No erythema.  Psychiatric: She has a normal mood and affect. Her behavior is normal. Thought content normal.   labs reviewed; trop 1.0, bun/cr 8/0.7 , h/h 9.4/31.7, plt 557 Wbc 8 ECG sinus bradycardia, early R-wave progression/transition, inferior Qs, nonspecific anterior and inferior ST changes CTA negative PE Chest x-ray: no pulmonary edema  Assessment/Plan [name] is a 34 yo woman with PMH of heavy menses requiring transfusion previously, tobacco use ~ 1ppd x > 12 years, obesity who presents with chest pain. She actually is found to have elevated troponin. Differential diagnosis is musculoskeletal pain, esophageal spasm, GERD, pericarditis, ACS/NSTEMI among  other etiologies. I favor a diagnosis of NSTEMI given elevated troponin and negative CTA for PE. However, vasospasm - coronary, pericarditis, heart failure also possibilities. For  now, favor observation, obtain echocardiogram, start heparin gtt and if workup unrevealing, likely pursue LHC tomorrow.  - NPO after MN for likely coronary angiogram - trend cardiac markers - observation on telemetry - asa 81 mg, start heparin gtt, atorvastatin 80 mg qHS - hba1c, tsh, lipid panel, BNP - echocardiogram in AM   Terry Morgan 09/11/2014, 11:15 PM

## 2014-09-11 NOTE — ED Notes (Signed)
Brown emesis noted. Pt refusing zofran, reports feeling like "the medicine is causing this." Pt requesting something to eat at present.

## 2014-09-11 NOTE — ED Notes (Addendum)
Pt c/o centralized chest pain intermittent x 2 months. Pt was seen last summer by PCP and was asked to follow up for further testing; pt was unable to follow up at that time. Pt was able to see PCP 2 months ago and given anti-anxiety medication and "something for heartburn." Reports today at 1pm pt had sharp, tightness in chest that subsided then returned at 4pm and has remained. CP associated with shortness of breath and nausea. Pt noted to be hypotensive and bradycardic. Reports similar symptoms in the past in which pt received a blood transfusion

## 2014-09-11 NOTE — ED Notes (Signed)
Pt assisted to restroom. Steady gait noted. Pt reports worsening chest pain on exertion

## 2014-09-11 NOTE — ED Notes (Signed)
The pt is c/o mid-chest pain for 2 months intermittently  Worse today.  She is supposed to to be getting worked up for the same.  nio cough.  She feels like she cannot get her breath  No respiratory distress at present.

## 2014-09-11 NOTE — ED Notes (Signed)
Spoke to family member via telephone regarding results, per patient's request. Patient enroute to CT for scan.

## 2014-09-12 ENCOUNTER — Inpatient Hospital Stay (HOSPITAL_COMMUNITY): Payer: Medicaid Other

## 2014-09-12 ENCOUNTER — Encounter (HOSPITAL_COMMUNITY)
Admission: EM | Disposition: A | Discharge status: Home / Self Care | Payer: Medicaid Other | Source: Home / Self Care | Attending: Cardiovascular Disease

## 2014-09-12 ENCOUNTER — Encounter (HOSPITAL_COMMUNITY): Payer: Self-pay | Admitting: Cardiovascular Disease

## 2014-09-12 DIAGNOSIS — F17210 Nicotine dependence, cigarettes, uncomplicated: Secondary | ICD-10-CM | POA: Diagnosis present

## 2014-09-12 DIAGNOSIS — D500 Iron deficiency anemia secondary to blood loss (chronic): Secondary | ICD-10-CM | POA: Diagnosis not present

## 2014-09-12 DIAGNOSIS — R079 Chest pain, unspecified: Secondary | ICD-10-CM | POA: Diagnosis present

## 2014-09-12 DIAGNOSIS — R739 Hyperglycemia, unspecified: Secondary | ICD-10-CM | POA: Diagnosis not present

## 2014-09-12 DIAGNOSIS — I214 Non-ST elevation (NSTEMI) myocardial infarction: Secondary | ICD-10-CM | POA: Diagnosis not present

## 2014-09-12 DIAGNOSIS — D649 Anemia, unspecified: Secondary | ICD-10-CM

## 2014-09-12 DIAGNOSIS — R112 Nausea with vomiting, unspecified: Secondary | ICD-10-CM | POA: Diagnosis not present

## 2014-09-12 DIAGNOSIS — D473 Essential (hemorrhagic) thrombocythemia: Secondary | ICD-10-CM | POA: Diagnosis not present

## 2014-09-12 DIAGNOSIS — I2582 Chronic total occlusion of coronary artery: Secondary | ICD-10-CM | POA: Diagnosis not present

## 2014-09-12 DIAGNOSIS — Z6841 Body Mass Index (BMI) 40.0 and over, adult: Secondary | ICD-10-CM | POA: Diagnosis not present

## 2014-09-12 DIAGNOSIS — E669 Obesity, unspecified: Secondary | ICD-10-CM | POA: Diagnosis present

## 2014-09-12 DIAGNOSIS — I2510 Atherosclerotic heart disease of native coronary artery without angina pectoris: Secondary | ICD-10-CM | POA: Diagnosis not present

## 2014-09-12 DIAGNOSIS — I213 ST elevation (STEMI) myocardial infarction of unspecified site: Secondary | ICD-10-CM | POA: Diagnosis not present

## 2014-09-12 DIAGNOSIS — D509 Iron deficiency anemia, unspecified: Secondary | ICD-10-CM | POA: Diagnosis present

## 2014-09-12 DIAGNOSIS — K219 Gastro-esophageal reflux disease without esophagitis: Secondary | ICD-10-CM | POA: Diagnosis present

## 2014-09-12 DIAGNOSIS — N920 Excessive and frequent menstruation with regular cycle: Secondary | ICD-10-CM | POA: Diagnosis present

## 2014-09-12 DIAGNOSIS — K5900 Constipation, unspecified: Secondary | ICD-10-CM | POA: Diagnosis not present

## 2014-09-12 HISTORY — PX: CARDIAC CATHETERIZATION: SHX172

## 2014-09-12 LAB — COMPREHENSIVE METABOLIC PANEL
ALT: 21 U/L (ref 14–54)
AST: 61 U/L — ABNORMAL HIGH (ref 15–41)
Albumin: 3.7 g/dL (ref 3.5–5.0)
Alkaline Phosphatase: 60 U/L (ref 38–126)
Anion gap: 8 (ref 5–15)
BUN: 6 mg/dL (ref 6–20)
CALCIUM: 9.1 mg/dL (ref 8.9–10.3)
CO2: 24 mmol/L (ref 22–32)
Chloride: 107 mmol/L (ref 101–111)
Creatinine, Ser: 0.73 mg/dL (ref 0.44–1.00)
GFR calc Af Amer: >60 mL/min (ref >60)
GFR calc non Af Amer: >60 mL/min (ref >60)
Glucose, Bld: 106 mg/dL — ABNORMAL HIGH (ref 65–99)
POTASSIUM: 3.9 mmol/L (ref 3.5–5.1)
Sodium: 139 mmol/L (ref 135–145)
Total Bilirubin: 0.4 mg/dL (ref 0.3–1.2)
Total Protein: 7.4 g/dL (ref 6.5–8.1)

## 2014-09-12 LAB — RAPID URINE DRUG SCREEN, HOSP PERFORMED
Amphetamines: NOT DETECTED
BENZODIAZEPINES: POSITIVE — AB
Barbiturates: NOT DETECTED
Cocaine: NOT DETECTED
OPIATES: NOT DETECTED
TETRAHYDROCANNABINOL: NOT DETECTED

## 2014-09-12 LAB — CBC
HCT: 30.9 % — ABNORMAL LOW (ref 36.0–46.0)
HEMATOCRIT: 27.9 % — AB (ref 36.0–46.0)
Hemoglobin: 9 g/dL — ABNORMAL LOW (ref 12.0–15.0)
Hemoglobin: 8.3 g/dL — ABNORMAL LOW (ref 12.0–15.0)
MCH: 19.9 pg — ABNORMAL LOW (ref 26.0–34.0)
MCH: 20.4 pg — AB (ref 26.0–34.0)
MCHC: 29.1 g/dL — AB (ref 30.0–36.0)
MCHC: 29.7 g/dL — ABNORMAL LOW (ref 30.0–36.0)
MCV: 68.2 fL — ABNORMAL LOW (ref 78.0–100.0)
MCV: 68.6 fL — ABNORMAL LOW (ref 78.0–100.0)
Platelets: 508 10*3/uL — ABNORMAL HIGH (ref 150–400)
Platelets: 380 10*3/uL (ref 150–400)
RBC: 4.53 MIL/uL (ref 3.87–5.11)
RBC: 4.07 MIL/uL (ref 3.87–5.11)
RDW: 24.7 % — AB (ref 11.5–15.5)
RDW: 24 % — AB (ref 11.5–15.5)
WBC: 9.1 10*3/uL (ref 4.0–10.5)
WBC: 8.5 10*3/uL (ref 4.0–10.5)

## 2014-09-12 LAB — POCT ACTIVATED CLOTTING TIME: ACTIVATED CLOTTING TIME: 411 s

## 2014-09-12 LAB — TSH: TSH: 0.495 u[IU]/mL (ref 0.350–4.500)

## 2014-09-12 LAB — LIPID PANEL
CHOL/HDL RATIO: 4.4 ratio
Cholesterol: 127 mg/dL (ref 0–200)
HDL: 29 mg/dL — ABNORMAL LOW (ref >40)
LDL Cholesterol: 86 mg/dL (ref 0–99)
Triglycerides: 62 mg/dL (ref <150)
VLDL: 12 mg/dL (ref 0–40)

## 2014-09-12 LAB — CREATININE, SERUM: CREATININE: 0.66 mg/dL (ref 0.44–1.00)

## 2014-09-12 LAB — TROPONIN I
Troponin I: 8.52 ng/mL (ref <0.031)
Troponin I: 25.27 ng/mL (ref <0.031)

## 2014-09-12 LAB — MAGNESIUM: MAGNESIUM: 1.8 mg/dL (ref 1.7–2.4)

## 2014-09-12 LAB — MRSA PCR SCREENING: MRSA by PCR: NEGATIVE

## 2014-09-12 SURGERY — LEFT HEART CATH AND CORONARY ANGIOGRAPHY

## 2014-09-12 MED ORDER — ASPIRIN 81 MG PO CHEW: 81.0000 mg | CHEWABLE_TABLET | ORAL | Status: DC

## 2014-09-12 MED ORDER — BIVALIRUDIN BOLUS VIA INFUSION - CUPID
INTRAVENOUS | Status: DC | PRN
  Administered 2014-09-12: 78.075 mg via INTRAVENOUS

## 2014-09-12 MED ORDER — LIDOCAINE HCL (PF) 1 % IJ SOLN
INTRAMUSCULAR | Status: AC
  Filled 2014-09-12: qty 30

## 2014-09-12 MED ORDER — VERAPAMIL HCL 2.5 MG/ML IV SOLN
INTRAVENOUS | Status: AC
  Filled 2014-09-12: qty 2

## 2014-09-12 MED ORDER — LABETALOL HCL 5 MG/ML IV SOLN
10.0000 mg | INTRAVENOUS | Status: DC | PRN
  Filled 2014-09-12: qty 4

## 2014-09-12 MED ORDER — MORPHINE SULFATE 2 MG/ML IJ SOLN: 2.0000 mg | INTRAMUSCULAR | Status: DC | PRN

## 2014-09-12 MED ORDER — ATORVASTATIN CALCIUM 80 MG PO TABS
80.0000 mg | ORAL_TABLET | Freq: Every day | ORAL | Status: DC
  Administered 2014-09-12 – 2014-09-15 (×3): 80 mg via ORAL
  Filled 2014-09-12 (×6): qty 1

## 2014-09-12 MED ORDER — ASPIRIN 300 MG RE SUPP: 300.0000 mg | RECTAL | Status: DC

## 2014-09-12 MED ORDER — SODIUM CHLORIDE 0.9 % IV SOLN
250.0000 mg | INTRAVENOUS | Status: DC | PRN
  Administered 2014-09-12: 1.75 mg/kg/h via INTRAVENOUS

## 2014-09-12 MED ORDER — CLOPIDOGREL BISULFATE 300 MG PO TABS
ORAL_TABLET | ORAL | Status: AC
  Filled 2014-09-12: qty 1

## 2014-09-12 MED ORDER — HEPARIN SODIUM (PORCINE) 5000 UNIT/ML IJ SOLN
5000.0000 [IU] | Freq: Three times a day (TID) | INTRAMUSCULAR | Status: DC
  Administered 2014-09-12 – 2014-09-15 (×5): 5000 [IU] via SUBCUTANEOUS
  Filled 2014-09-12 (×14): qty 1

## 2014-09-12 MED ORDER — PANTOPRAZOLE SODIUM 20 MG PO TBEC
20.0000 mg | DELAYED_RELEASE_TABLET | Freq: Every day | ORAL | Status: DC
  Administered 2014-09-12 – 2014-09-15 (×3): 20 mg via ORAL
  Filled 2014-09-12 (×4): qty 1

## 2014-09-12 MED ORDER — IOHEXOL 350 MG/ML SOLN
INTRAVENOUS | Status: DC | PRN
  Administered 2014-09-12: 150 mL via INTRA_ARTERIAL

## 2014-09-12 MED ORDER — LIDOCAINE HCL (PF) 1 % IJ SOLN
INTRAMUSCULAR | Status: DC | PRN
  Administered 2014-09-12: 5 mL via INTRADERMAL
  Administered 2014-09-12: 20 mL via INTRADERMAL

## 2014-09-12 MED ORDER — HEPARIN (PORCINE) IN NACL 100-0.45 UNIT/ML-% IJ SOLN
900.0000 [IU]/h | INTRAMUSCULAR | Status: DC
  Administered 2014-09-12: 900 [IU]/h via INTRAVENOUS
  Filled 2014-09-12: qty 250

## 2014-09-12 MED ORDER — METOPROLOL TARTRATE 12.5 MG HALF TABLET
12.5000 mg | ORAL_TABLET | Freq: Two times a day (BID) | ORAL | Status: DC
  Administered 2014-09-12 – 2014-09-16 (×5): 12.5 mg via ORAL
  Filled 2014-09-12 (×10): qty 1

## 2014-09-12 MED ORDER — CLOPIDOGREL BISULFATE 75 MG PO TABS
75.0000 mg | ORAL_TABLET | Freq: Every day | ORAL | Status: DC
  Administered 2014-09-13 – 2014-09-16 (×3): 75 mg via ORAL
  Filled 2014-09-12 (×4): qty 1

## 2014-09-12 MED ORDER — CLOPIDOGREL BISULFATE 75 MG PO TABS
ORAL_TABLET | ORAL | Status: DC | PRN
  Administered 2014-09-12: 600 mg via ORAL

## 2014-09-12 MED ORDER — SODIUM CHLORIDE 0.9 % IV SOLN: 250.0000 mL | INTRAVENOUS | Status: DC | PRN

## 2014-09-12 MED ORDER — SODIUM CHLORIDE 0.9 % IV SOLN
INTRAVENOUS | Status: DC | PRN
  Administered 2014-09-12: 250 mL via INTRAVENOUS

## 2014-09-12 MED ORDER — SODIUM CHLORIDE 0.9 % IV SOLN
1.7500 mg/kg/h | INTRAVENOUS | Status: DC
  Filled 2014-09-12 (×2): qty 250

## 2014-09-12 MED ORDER — HEPARIN SODIUM (PORCINE) 1000 UNIT/ML IJ SOLN
INTRAMUSCULAR | Status: AC
  Filled 2014-09-12: qty 1

## 2014-09-12 MED ORDER — FENTANYL CITRATE (PF) 100 MCG/2ML IJ SOLN
INTRAMUSCULAR | Status: DC | PRN
  Administered 2014-09-12: 25 ug via INTRAVENOUS
  Administered 2014-09-12: 50 ug via INTRAVENOUS
  Administered 2014-09-12 (×2): 25 ug via INTRAVENOUS

## 2014-09-12 MED ORDER — NITROGLYCERIN 0.4 MG SL SUBL: 0.4000 mg | SUBLINGUAL_TABLET | SUBLINGUAL | Status: DC | PRN

## 2014-09-12 MED ORDER — MIDAZOLAM HCL 2 MG/2ML IJ SOLN
INTRAMUSCULAR | Status: DC | PRN
  Administered 2014-09-12 (×4): 1 mg via INTRAVENOUS

## 2014-09-12 MED ORDER — HEPARIN (PORCINE) IN NACL 2-0.9 UNIT/ML-% IJ SOLN
INTRAMUSCULAR | Status: AC
Start: 2014-09-12 — End: 2014-09-12
  Filled 2014-09-12: qty 1000

## 2014-09-12 MED ORDER — HEPARIN SODIUM (PORCINE) 1000 UNIT/ML IJ SOLN
INTRAMUSCULAR | Status: DC | PRN
  Administered 2014-09-12: 5000 [IU] via INTRAVENOUS

## 2014-09-12 MED ORDER — ACETAMINOPHEN 325 MG PO TABS: 650.0000 mg | ORAL_TABLET | ORAL | Status: DC | PRN

## 2014-09-12 MED ORDER — SODIUM CHLORIDE 0.9 % IJ SOLN
3.0000 mL | Freq: Two times a day (BID) | INTRAMUSCULAR | Status: DC
  Administered 2014-09-12 – 2014-09-15 (×7): 3 mL via INTRAVENOUS

## 2014-09-12 MED ORDER — OXYCODONE-ACETAMINOPHEN 5-325 MG PO TABS: 1.0000 | ORAL_TABLET | ORAL | Status: DC | PRN

## 2014-09-12 MED ORDER — VERAPAMIL HCL 2.5 MG/ML IV SOLN
INTRAVENOUS | Status: DC | PRN
  Administered 2014-09-12 (×2): via INTRA_ARTERIAL

## 2014-09-12 MED ORDER — SODIUM CHLORIDE 0.9 % IV SOLN: INTRAVENOUS | Status: DC

## 2014-09-12 MED ORDER — SODIUM CHLORIDE 0.9 % IJ SOLN: 3.0000 mL | INTRAMUSCULAR | Status: DC | PRN

## 2014-09-12 MED ORDER — ONDANSETRON HCL 4 MG/2ML IJ SOLN
4.0000 mg | Freq: Four times a day (QID) | INTRAMUSCULAR | Status: DC | PRN
  Administered 2014-09-12 – 2014-09-14 (×3): 4 mg via INTRAVENOUS
  Filled 2014-09-12 (×3): qty 2

## 2014-09-12 MED ORDER — ASPIRIN EC 81 MG PO TBEC
81.0000 mg | DELAYED_RELEASE_TABLET | Freq: Every day | ORAL | Status: DC
  Administered 2014-09-13 – 2014-09-16 (×3): 81 mg via ORAL
  Filled 2014-09-12 (×4): qty 1

## 2014-09-12 MED ORDER — MIDAZOLAM HCL 2 MG/2ML IJ SOLN
INTRAMUSCULAR | Status: AC
  Filled 2014-09-12: qty 2

## 2014-09-12 MED ORDER — FENTANYL CITRATE (PF) 100 MCG/2ML IJ SOLN
INTRAMUSCULAR | Status: AC
  Filled 2014-09-12: qty 2

## 2014-09-12 MED ORDER — BIVALIRUDIN 250 MG IV SOLR
INTRAVENOUS | Status: AC
  Filled 2014-09-12: qty 250

## 2014-09-12 MED ORDER — SODIUM CHLORIDE 0.9 % IV SOLN
INTRAVENOUS | Status: AC
  Administered 2014-09-12 (×2): via INTRAVENOUS

## 2014-09-12 MED ORDER — HEPARIN BOLUS VIA INFUSION
4000.0000 [IU] | Freq: Once | INTRAVENOUS | Status: AC
  Administered 2014-09-12: 4000 [IU] via INTRAVENOUS
  Filled 2014-09-12: qty 4000

## 2014-09-12 MED ORDER — PNEUMOCOCCAL VAC POLYVALENT 25 MCG/0.5ML IJ INJ
0.5000 mL | INJECTION | INTRAMUSCULAR | Status: DC
  Filled 2014-09-12: qty 0.5

## 2014-09-12 MED ORDER — NITROGLYCERIN 0.2 MG/ML ON CALL CATH LAB
INTRAVENOUS | Status: DC | PRN
  Administered 2014-09-12: 0.2 mg via INTRACORONARY
  Administered 2014-09-12: 200 ug via INTRAVENOUS
  Administered 2014-09-12: 0.2 mg via INTRA_ARTERIAL
  Administered 2014-09-12: 0.2 mg via INTRACORONARY
  Administered 2014-09-12: 0.2 mg via INTRA_ARTERIAL
  Administered 2014-09-12: 0.2 mg via INTRACORONARY

## 2014-09-12 MED ORDER — ASPIRIN 81 MG PO CHEW: 324.0000 mg | CHEWABLE_TABLET | ORAL | Status: DC

## 2014-09-12 MED ORDER — PROMETHAZINE HCL 25 MG/ML IJ SOLN
25.0000 mg | Freq: Once | INTRAMUSCULAR | Status: AC
  Administered 2014-09-12: 25 mg via INTRAVENOUS
  Filled 2014-09-12: qty 1

## 2014-09-12 MED ORDER — NITROGLYCERIN 1 MG/10 ML FOR IR/CATH LAB
INTRA_ARTERIAL | Status: AC
  Filled 2014-09-12: qty 10

## 2014-09-12 MED ORDER — PROMETHAZINE HCL 25 MG/ML IJ SOLN
12.5000 mg | Freq: Four times a day (QID) | INTRAMUSCULAR | Status: DC | PRN
  Administered 2014-09-12 (×2): 12.5 mg via INTRAVENOUS
  Filled 2014-09-12 (×2): qty 1

## 2014-09-12 SURGICAL SUPPLY — 25 items
BALLN EUPHORA RX 2.5X15 (BALLOONS) ×3
BALLN ~~LOC~~ TREK RX 4.5X15 (BALLOONS) ×3
BALLOON EUPHORA RX 2.5X15 (BALLOONS) IMPLANT
BALLOON ~~LOC~~ TREK RX 4.5X15 (BALLOONS) IMPLANT
CATH EXTRAC PRONTO 5.5F 138CM (CATHETERS) ×2 IMPLANT
CATH INFINITI 5 FR JL3.5 (CATHETERS) ×3 IMPLANT
CATH INFINITI 5FR ANG PIGTAIL (CATHETERS) ×3 IMPLANT
CATH INFINITI JR4 5F (CATHETERS) ×3 IMPLANT
CATH LAUNCHER 5F JR4 (CATHETERS) ×2 IMPLANT
CATH VISTA GUIDE 6FR JR4 (CATHETERS) ×2 IMPLANT
DEVICE CLOSURE PERCLS PRGLD 6F (VASCULAR PRODUCTS) IMPLANT
DEVICE RAD COMP TR BAND LRG (VASCULAR PRODUCTS) ×3 IMPLANT
GLIDESHEATH SLEND SS 6F .021 (SHEATH) ×3 IMPLANT
KIT ENCORE 26 ADVANTAGE (KITS) ×2 IMPLANT
KIT HEART LEFT (KITS) ×3 IMPLANT
PACK CARDIAC CATHETERIZATION (CUSTOM PROCEDURE TRAY) ×3 IMPLANT
PERCLOSE PROGLIDE 6F (VASCULAR PRODUCTS) ×3
SHEATH PINNACLE 6F 10CM (SHEATH) ×2 IMPLANT
STENT SYNERGY DES 4X12 (Permanent Stent) ×2 IMPLANT
STENT SYNERGY DES 4X24 (Permanent Stent) ×2 IMPLANT
SYR MEDRAD MARK V 150ML (SYRINGE) ×3 IMPLANT
TRANSDUCER W/STOPCOCK (MISCELLANEOUS) ×3 IMPLANT
TUBING CIL FLEX 10 FLL-RA (TUBING) ×3 IMPLANT
WIRE COUGAR XT STRL 190CM (WIRE) ×4 IMPLANT
WIRE HI TORQ VERSACORE-J 145CM (WIRE) ×2 IMPLANT

## 2014-09-12 NOTE — Progress Notes (Signed)
Received critical troponin of 8.52 this morning. PA notified of lab value. EKG was done. Pt is resting. Will continue to monitor.  Albertina Senegal E

## 2014-09-12 NOTE — Progress Notes (Signed)
Echocardiogram 2D Echocardiogram has been performed.  Terry Morgan 09/12/2014, 2:41 PM

## 2014-09-12 NOTE — Progress Notes (Signed)
Pt requested that heparin gtt be cut off. She thinks that this is the source of N&V. Dr. Claiborne Billings notified. Pt is stable. Will continue to monitor.  Albertina Senegal E

## 2014-09-12 NOTE — Interval H&P Note (Signed)
History and Physical Interval Note:  09/12/2014 7:29 AM  Terry Morgan  has presented today for surgery, with the diagnosis of urgent  The various methods of treatment have been discussed with the patient and family. After consideration of risks, benefits and other options for treatment, the patient has consented to  Procedure(s): Left Heart Cath and Coronary Angiography (N/A) as a surgical intervention .  The patient's history has been reviewed, patient examined, no change in status, stable for surgery.  I have reviewed the patient's chart and labs.  Questions were answered to the patient's satisfaction.    Cath Lab Visit (complete for each Cath Lab visit)  Clinical Evaluation Leading to the Procedure:   ACS: Yes.    Non-ACS:    Anginal Classification: CCS IV  Anti-ischemic medical therapy: No Therapy  Non-Invasive Test Results: No non-invasive testing performed  Prior CABG: No previous CABG       Sherren Mocha

## 2014-09-12 NOTE — Progress Notes (Signed)
CRITICAL VALUE ALERT  Critical value received:  Troponin 25.27  Date of notification:  09/12/14   Time of notification:  9767 Critical value read back:Yes.    Nurse who received alert:  Rosebud Poles RN  MD notified (1st page):  Dr. Acie Fredrickson  Time of first page:  59  MD notified (2nd page):  Time of second page:  Responding MD:  Dr. Acie Fredrickson  Time MD responded:  1330

## 2014-09-12 NOTE — Care Management Note (Signed)
Case Management Note  Patient Details  Name: Terry Morgan MRN: [id number] Date of Birth: November 19, 1980  Subjective/Objective:   Adm w nstemi                 Action/Plan: lives at home, medicaid listed as ins for meds   Expected Discharge Date:                  Expected Discharge Plan:  Home/Self Care  In-House Referral:     Discharge planning Services     Post Acute Care Choice:    Choice offered to:     DME Arranged:    DME Agency:     HH Arranged:    [place] Agency:     Status of Service:     Medicare Important Message Given:    Date Medicare IM Given:    Medicare IM give by:    Date Additional Medicare IM Given:    Additional Medicare Important Message give by:     If discussed at [place] of Stay Meetings, dates discussed:    Additional Comments: ur review done  Lacretia Leigh, RN 09/12/2014, 10:04 AM

## 2014-09-12 NOTE — ED Notes (Signed)
Updated pt's sister of plan of care and bed assignment, per pt's request.

## 2014-09-12 NOTE — Progress Notes (Signed)
Patient: Terry Morgan / Admit Date: 09/11/2014 / Date of Encounter: 09/12/2014, 7:08 AM   Subjective: Patient c/o nausea and vomiting overnight. Phenergan gave some relief. I was called for + troponin of 8.5. She c/o ongoing chest pain, 9/10. EKG NSR with TWI V2-V6, minimal ST elevation in lead III, avF with nonspecific TW change in those leads.   Objective: Telemetry: NSR Physical Exam: Blood pressure 104/57, pulse 54, temperature 99.5 F (37.5 C), temperature source Oral, resp. rate 18, height 5' (1.524 m), weight 229 lb 6.4 oz (104.055 kg), last menstrual period 09/04/2014, SpO2 100 %. General: Well developed, well nourished AAF, in no acute distress. Lying flat Head: Normocephalic, atraumatic, sclera non-icteric, no xanthomas, nares are without discharge. Neck: Negative for carotid bruits. JVP not elevated. Lungs: Clear bilaterally to auscultation without wheezes, rales, or rhonchi. Breathing is unlabored. Heart: RRR S1 S2 without murmurs, rubs, or gallops.  Abdomen: Soft, non-tender, non-distended with normoactive bowel sounds. No rebound/guarding. Extremities: No clubbing or cyanosis. No edema. Distal pedal pulses are 2+ and equal bilaterally. Neuro: Alert and oriented X 3. Moves all extremities spontaneously. Psych:  Responds to questions appropriately with a normal affect.   Intake/Output Summary (Last 24 hours) at 09/12/14 0708 Last data filed at 09/12/14 0300  Gross per 24 hour  Intake   1000 ml  Output    700 ml  Net    300 ml    Inpatient Medications:  . aspirin  81 mg Oral Pre-Cath  . [START ON 09/13/2014] aspirin EC  81 mg Oral Daily  . atorvastatin  80 mg Oral q1800  . ondansetron (ZOFRAN) IV  4 mg Intravenous Once   Infusions:  . sodium chloride    . heparin Stopped (09/12/14 0555)    Labs:  Recent Labs  09/11/14 1850 09/12/14 0305  NA 135 139  K 4.0 3.9  CL 104 107  CO2 22 24  GLUCOSE 100* 106*  BUN 8 6  CREATININE 0.70 0.73  CALCIUM 9.2 9.1    MG  --  1.8    Recent Labs  09/12/14 0305  AST 61*  ALT 21  ALKPHOS 60  BILITOT 0.4  PROT 7.4  ALBUMIN 3.7    Recent Labs  09/11/14 1851 09/12/14 0305  WBC 8.0 9.1  NEUTROABS 4.3  --   HGB 9.4* 9.0*  HCT 31.7* 30.9*  MCV 68.5* 68.2*  PLT 557* 508*    Recent Labs  09/11/14 1850 09/12/14 0305  TROPONINI 1.00* 8.52*   Invalid input(s): POCBNP No results for input(s): HGBA1C in the last 72 hours.   Radiology/Studies:  Dg Chest 2 View  09/11/2014   CLINICAL DATA:  Centralized chest pain shortness of breath for 1 week. History of smoking.  EXAM: CHEST  2 VIEW  COMPARISON:  None.  FINDINGS: Borderline enlarged cardiac silhouette. Normal mediastinal contours. Minimal bilateral infrahilar heterogeneous opacities favored to represent atelectasis. No discrete focal airspace opacities. There is mild diffuse slightly nodular thickening of the pulmonary interstitium. There is minimal pleural parenchymal thickening about the right minor fissure. No pleural effusion or pneumothorax. No evidence of edema. No acute osseus abnormalities.  IMPRESSION: Findings suggestive of airways disease / bronchitis. No focal airspace opacities to suggest pneumonia.   Electronically Signed   By: Sandi Mariscal M.D.   On: 09/11/2014 20:42   Ct Angio Chest Pe W/cm &/or Wo Cm  09/11/2014   CLINICAL DATA:  Initial UA shin for acute chest tightness, shortness of breath.  EXAM: CT  ANGIOGRAPHY CHEST WITH CONTRAST  TECHNIQUE: Multidetector CT imaging of the chest was performed using the standard protocol during bolus administration of intravenous contrast. Multiplanar CT image reconstructions and MIPs were obtained to evaluate the vascular anatomy.  CONTRAST:  156mL OMNIPAQUE IOHEXOL 350 MG/ML SOLN  COMPARISON:  Prior radiograph from earlier the same day  FINDINGS: Visualized thyroid gland is within normal limits. Left thyroid lobe is extends inferiorly towards the substernal region. No pathologically enlarged  mediastinal, hilar, or axillary lymph nodes identified.  Intrathoracic aorta of normal caliber and appearance. Great vessels within normal limits.  Heart size normal.  No pericardial effusion.  Pulmonary arterial tree is adequately opacified for evaluation. No filling defect to suggest acute pulmonary embolism identified. Re-formatted imaging confirms these findings.  Lungs are clear without focal infiltrate or pulmonary edema. No pleural effusion. No pneumothorax. No worrisome pulmonary nodule or mass. Mild subsegmental atelectasis seen dependently within the lower lobes bilaterally.  Visualized portions of the upper abdomen are unremarkable.  No acute osseous abnormality. No worrisome lytic or blastic osseous lesions.  IMPRESSION: 1. No CT evidence for acute pulmonary embolism. 2. No other acute cardiopulmonary process identified.   Electronically Signed   By: Jeannine Boga M.D.   On: 09/11/2014 22:37     Assessment and Plan   1. Ongoing chest pain and rising troponin concerning for NSTEMI 2. Microcytic anemia likely due to heavy menses 3. Mild hyperglycemia - A1c pending 4. Tobacco use 5. Thrombocytosis  EKG, rising troponin and EKG concerning for ongoing acute coronary syndrome. Given age and lack of significant risk factors other than smoking, there is concern for coronary dissection. A true NSTEMI is also possible. BP is on the softer side and has limited medical therapy. She requested to stop IV heparin overnight because she felt it was making her feel bad - but I suspect her ACS is what is making her feel bad. Recommend to proceed with urgent cardiac cath. Risks and benefits of cardiac catheterization have been discussed with the patient. These include bleeding, infection, kidney damage, stroke, heart attack, death. The patient understands these risks and is willing to proceed. Discussed with Dr. Burt Knack who agrees. Lab has been made aware and I have asked nursing to help prep patient.    Signed, Melina Copa PA-C Pager: [phone]  Patient seen, examined. Available data reviewed. Agree with findings, assessment, and plan as outlined by Melina Copa, PA-C. I was called to see this patient who is having ongoing chest discomfort. Terry Morgan is a 34 year old woman with no past cardiac history. She has presented with nausea, vomiting, and chest pain. Her troponin is increasing and she has an abnormal EKG with diffuse ST/T-wave changes suggestive of ischemia. Her EKG does not meet criteria for STEMI. In the setting of ongoing ischemic symptoms and elevated troponin, I have recommended emergency cardiac catheterization. The patient is somnolent after receiving Phenergan. However, she is easily arousable and responds appropriately to all questions. Heart is regular rate and rhythm with a soft ejection murmur at the left sternal border. Lungs are clear. There is no peripheral edema. Extremities are warm. Distal pulses are intact. I have reviewed our plan for emergency cardiac catheterization and possible PCI with the patient. I explained the risks, potential benefits, and alternatives to the patient. We contacted a friend I telephone who is calling her other family members to notify them. Emergency consent was obtained. Further plans pending cardiac catheterization results.  Sherren Mocha, M.D. 09/12/2014 7:20 AM

## 2014-09-12 NOTE — Progress Notes (Signed)
ANTICOAGULATION CONSULT NOTE - Initial Consult  Pharmacy Consult for Heparin Indication: chest pain/ACS  No Known Allergies  Patient Measurements: Height: 5' (152.4 cm) Weight: 229 lb 6.4 oz (104.055 kg) IBW/kg (Calculated) : 45.5 Heparin Dosing Weight: 71 kg  Vital Signs: Temp: 97.5 F (36.4 C) (05/26 0115) Temp Source: Oral (05/26 0115) BP: 113/58 mmHg (05/26 0115) Pulse Rate: 61 (05/26 0115)  Labs:  Recent Labs  09/11/14 1850 09/11/14 1851  HGB  --  9.4*  HCT  --  31.7*  PLT  --  557*  CREATININE 0.70  --   TROPONINI 1.00*  --     Estimated Creatinine Clearance: 107.8 mL/min (by C-G formula based on Cr of 0.7).   Medical History: Past Medical History  Diagnosis Date  . Blood transfusion without reported diagnosis     Medications:  Prescriptions prior to admission  Medication Sig Dispense Refill Last Dose  . ibuprofen (ADVIL,MOTRIN) 600 MG tablet Take 1 tablet (600 mg total) by mouth every 6 (six) hours as needed. (Patient not taking: Reported on 09/11/2014) 30 tablet 0 Not Taking at Unknown time   Scheduled:  . aspirin  324 mg Oral NOW   Or  . aspirin  300 mg Rectal NOW  . [START ON 09/13/2014] aspirin EC  81 mg Oral Daily  . atorvastatin  80 mg Oral q1800  . ondansetron (ZOFRAN) IV  4 mg Intravenous Once   Infusions:    Assessment: 34yo female with history of heavy menses requiring transfusion previously, obesity, and tobacco use presents with chest pain. Pharmacy is consulted to dose heparin for ACS/chest pain. Hgb 9.4, Plt 557, BNP 16.5, Trop 1.0.  Goal of Therapy:  Heparin level 0.3-0.7 units/ml Monitor platelets by anticoagulation protocol: Yes   Plan:  Give 4000 units bolus x 1 Start heparin infusion at 900 units/hr Check anti-Xa level in 6 hours and daily while on heparin Continue to monitor H&H and platelets  Andrey Cota. Diona Foley, PharmD Clinical Pharmacist Pager [phone] 09/12/2014,1:26 AM

## 2014-09-12 NOTE — H&P (View-Only) (Signed)
Patient: Terry Morgan / Admit Date: 09/11/2014 / Date of Encounter: 09/12/2014, 7:08 AM   Subjective: Patient c/o nausea and vomiting overnight. Phenergan gave some relief. I was called for + troponin of 8.5. She c/o ongoing chest pain, 9/10. EKG NSR with TWI V2-V6, minimal ST elevation in lead III, avF with nonspecific TW change in those leads.   Objective: Telemetry: NSR Physical Exam: Blood pressure 104/57, pulse 54, temperature 99.5 F (37.5 C), temperature source Oral, resp. rate 18, height 5' (1.524 m), weight 229 lb 6.4 oz (104.055 kg), last menstrual period 09/04/2014, SpO2 100 %. General: Well developed, well nourished AAF, in no acute distress. Lying flat Head: Normocephalic, atraumatic, sclera non-icteric, no xanthomas, nares are without discharge. Neck: Negative for carotid bruits. JVP not elevated. Lungs: Clear bilaterally to auscultation without wheezes, rales, or rhonchi. Breathing is unlabored. Heart: RRR S1 S2 without murmurs, rubs, or gallops.  Abdomen: Soft, non-tender, non-distended with normoactive bowel sounds. No rebound/guarding. Extremities: No clubbing or cyanosis. No edema. Distal pedal pulses are 2+ and equal bilaterally. Neuro: Alert and oriented X 3. Moves all extremities spontaneously. Psych:  Responds to questions appropriately with a normal affect.   Intake/Output Summary (Last 24 hours) at 09/12/14 0708 Last data filed at 09/12/14 0300  Gross per 24 hour  Intake   1000 ml  Output    700 ml  Net    300 ml    Inpatient Medications:  . aspirin  81 mg Oral Pre-Cath  . [START ON 09/13/2014] aspirin EC  81 mg Oral Daily  . atorvastatin  80 mg Oral q1800  . ondansetron (ZOFRAN) IV  4 mg Intravenous Once   Infusions:  . sodium chloride    . heparin Stopped (09/12/14 0555)    Labs:  Recent Labs  09/11/14 1850 09/12/14 0305  NA 135 139  K 4.0 3.9  CL 104 107  CO2 22 24  GLUCOSE 100* 106*  BUN 8 6  CREATININE 0.70 0.73  CALCIUM 9.2 9.1    MG  --  1.8    Recent Labs  09/12/14 0305  AST 61*  ALT 21  ALKPHOS 60  BILITOT 0.4  PROT 7.4  ALBUMIN 3.7    Recent Labs  09/11/14 1851 09/12/14 0305  WBC 8.0 9.1  NEUTROABS 4.3  --   HGB 9.4* 9.0*  HCT 31.7* 30.9*  MCV 68.5* 68.2*  PLT 557* 508*    Recent Labs  09/11/14 1850 09/12/14 0305  TROPONINI 1.00* 8.52*   Invalid input(s): POCBNP No results for input(s): HGBA1C in the last 72 hours.   Radiology/Studies:  Dg Chest 2 View  09/11/2014   CLINICAL DATA:  Centralized chest pain shortness of breath for 1 week. History of smoking.  EXAM: CHEST  2 VIEW  COMPARISON:  None.  FINDINGS: Borderline enlarged cardiac silhouette. Normal mediastinal contours. Minimal bilateral infrahilar heterogeneous opacities favored to represent atelectasis. No discrete focal airspace opacities. There is mild diffuse slightly nodular thickening of the pulmonary interstitium. There is minimal pleural parenchymal thickening about the right minor fissure. No pleural effusion or pneumothorax. No evidence of edema. No acute osseus abnormalities.  IMPRESSION: Findings suggestive of airways disease / bronchitis. No focal airspace opacities to suggest pneumonia.   Electronically Signed   By: Sandi Mariscal M.D.   On: 09/11/2014 20:42   Ct Angio Chest Pe W/cm &/or Wo Cm  09/11/2014   CLINICAL DATA:  Initial UA shin for acute chest tightness, shortness of breath.  EXAM: CT  ANGIOGRAPHY CHEST WITH CONTRAST  TECHNIQUE: Multidetector CT imaging of the chest was performed using the standard protocol during bolus administration of intravenous contrast. Multiplanar CT image reconstructions and MIPs were obtained to evaluate the vascular anatomy.  CONTRAST:  151mL OMNIPAQUE IOHEXOL 350 MG/ML SOLN  COMPARISON:  Prior radiograph from earlier the same day  FINDINGS: Visualized thyroid gland is within normal limits. Left thyroid lobe is extends inferiorly towards the substernal region. No pathologically enlarged  mediastinal, hilar, or axillary lymph nodes identified.  Intrathoracic aorta of normal caliber and appearance. Great vessels within normal limits.  Heart size normal.  No pericardial effusion.  Pulmonary arterial tree is adequately opacified for evaluation. No filling defect to suggest acute pulmonary embolism identified. Re-formatted imaging confirms these findings.  Lungs are clear without focal infiltrate or pulmonary edema. No pleural effusion. No pneumothorax. No worrisome pulmonary nodule or mass. Mild subsegmental atelectasis seen dependently within the lower lobes bilaterally.  Visualized portions of the upper abdomen are unremarkable.  No acute osseous abnormality. No worrisome lytic or blastic osseous lesions.  IMPRESSION: 1. No CT evidence for acute pulmonary embolism. 2. No other acute cardiopulmonary process identified.   Electronically Signed   By: Jeannine Boga M.D.   On: 09/11/2014 22:37     Assessment and Plan   1. Ongoing chest pain and rising troponin concerning for NSTEMI 2. Microcytic anemia likely due to heavy menses 3. Mild hyperglycemia - A1c pending 4. Tobacco use 5. Thrombocytosis  EKG, rising troponin and EKG concerning for ongoing acute coronary syndrome. Given age and lack of significant risk factors other than smoking, there is concern for coronary dissection. A true NSTEMI is also possible. BP is on the softer side and has limited medical therapy. She requested to stop IV heparin overnight because she felt it was making her feel bad - but I suspect her ACS is what is making her feel bad. Recommend to proceed with urgent cardiac cath. Risks and benefits of cardiac catheterization have been discussed with the patient. These include bleeding, infection, kidney damage, stroke, heart attack, death. The patient understands these risks and is willing to proceed. Discussed with Dr. Burt Knack who agrees. Lab has been made aware and I have asked nursing to help prep patient.    Signed, Melina Copa PA-C Pager: [phone]  Patient seen, examined. Available data reviewed. Agree with findings, assessment, and plan as outlined by Melina Copa, PA-C. I was called to see this patient who is having ongoing chest discomfort. Ms. Harries is a 34 year old woman with no past cardiac history. She has presented with nausea, vomiting, and chest pain. Her troponin is increasing and she has an abnormal EKG with diffuse ST/T-wave changes suggestive of ischemia. Her EKG does not meet criteria for STEMI. In the setting of ongoing ischemic symptoms and elevated troponin, I have recommended emergency cardiac catheterization. The patient is somnolent after receiving Phenergan. However, she is easily arousable and responds appropriately to all questions. Heart is regular rate and rhythm with a soft ejection murmur at the left sternal border. Lungs are clear. There is no peripheral edema. Extremities are warm. Distal pulses are intact. I have reviewed our plan for emergency cardiac catheterization and possible PCI with the patient. I explained the risks, potential benefits, and alternatives to the patient. We contacted a friend I telephone who is calling her other family members to notify them. Emergency consent was obtained. Further plans pending cardiac catheterization results.  Sherren Mocha, M.D. 09/12/2014 7:20 AM

## 2014-09-12 NOTE — ED Notes (Signed)
Pt with meal at bedside. Pt reports cardiologist told her NPO after 2am.

## 2014-09-13 DIAGNOSIS — D500 Iron deficiency anemia secondary to blood loss (chronic): Secondary | ICD-10-CM

## 2014-09-13 LAB — CBC
HEMATOCRIT: 27.8 % — AB (ref 36.0–46.0)
HEMATOCRIT: 26.5 % — AB (ref 36.0–46.0)
HEMOGLOBIN: 7.9 g/dL — AB (ref 12.0–15.0)
Hemoglobin: 8.4 g/dL — ABNORMAL LOW (ref 12.0–15.0)
MCH: 20.8 pg — AB (ref 26.0–34.0)
MCH: 20.2 pg — ABNORMAL LOW (ref 26.0–34.0)
MCHC: 30.2 g/dL (ref 30.0–36.0)
MCHC: 29.8 g/dL — ABNORMAL LOW (ref 30.0–36.0)
MCV: 68.8 fL — ABNORMAL LOW (ref 78.0–100.0)
MCV: 67.6 fL — AB (ref 78.0–100.0)
PLATELETS: 356 10*3/uL (ref 150–400)
Platelets: 335 10*3/uL (ref 150–400)
RBC: 4.04 MIL/uL (ref 3.87–5.11)
RBC: 3.92 MIL/uL (ref 3.87–5.11)
RDW: 24.1 % — ABNORMAL HIGH (ref 11.5–15.5)
RDW: 23.6 % — ABNORMAL HIGH (ref 11.5–15.5)
WBC: 7.8 10*3/uL (ref 4.0–10.5)
WBC: 6.2 10*3/uL (ref 4.0–10.5)

## 2014-09-13 LAB — BASIC METABOLIC PANEL
Anion gap: 10 (ref 5–15)
BUN: 5 mg/dL — AB (ref 6–20)
CO2: 23 mmol/L (ref 22–32)
Calcium: 8.7 mg/dL — ABNORMAL LOW (ref 8.9–10.3)
Chloride: 106 mmol/L (ref 101–111)
Creatinine, Ser: 0.69 mg/dL (ref 0.44–1.00)
GFR calc non Af Amer: >60 mL/min (ref >60)
Glucose, Bld: 68 mg/dL (ref 65–99)
POTASSIUM: 3.8 mmol/L (ref 3.5–5.1)
Sodium: 139 mmol/L (ref 135–145)

## 2014-09-13 LAB — HEMOGLOBIN A1C
HEMOGLOBIN A1C: 5.7 % — AB (ref 4.8–5.6)
MEAN PLASMA GLUCOSE: 117 mg/dL

## 2014-09-13 MED ORDER — FERROUS SULFATE 325 (65 FE) MG PO TABS
325.0000 mg | ORAL_TABLET | Freq: Every day | ORAL | Status: DC
  Administered 2014-09-15: 325 mg via ORAL
  Filled 2014-09-13 (×3): qty 1

## 2014-09-13 MED ORDER — METOCLOPRAMIDE HCL 5 MG/ML IJ SOLN
10.0000 mg | Freq: Once | INTRAMUSCULAR | Status: AC
  Administered 2014-09-13: 10 mg via INTRAVENOUS
  Filled 2014-09-13: qty 2

## 2014-09-13 MED ORDER — FERROUS SULFATE 325 (65 FE) MG PO TABS
325.0000 mg | ORAL_TABLET | Freq: Every day | ORAL | Status: DC
  Filled 2014-09-13 (×2): qty 1

## 2014-09-13 MED FILL — Clopidogrel Bisulfate Tab 300 MG (Base Equiv): ORAL | Qty: 2 | Status: AC

## 2014-09-13 MED FILL — Heparin Sodium (Porcine) 2 Unit/ML in Sodium Chloride 0.9%: INTRAMUSCULAR | Qty: 1000 | Status: AC

## 2014-09-13 NOTE — Progress Notes (Signed)
Report given to 2W RN. Will transfer patient shortly.

## 2014-09-13 NOTE — Progress Notes (Signed)
Pt still c/o nausea and tiredness. Refused ambulation or education. Left materials for her to start reading when she feels better. Will f/u in am. Yves Dill CES, ACSM 2:04 PM 09/13/2014

## 2014-09-13 NOTE — Progress Notes (Addendum)
Subjective: No chest pain, no SOB, Feeling of nausea today, she says its the meds she is getting that is making the nausea worse, but she says she will rather not have anything for the nausea. Pt is a smoker, and plans not to resume smoking, also denies family history of CAD. She has worked around her room without chest pain.  Objective: Filed Vitals:   09/13/14 0400 09/13/14 0500 09/13/14 0729 09/13/14 0800  BP: 128/66   115/66  Pulse: 66     Temp: 98.6 F (37 C)  98.7 F (37.1 C)   TempSrc: Oral  Oral   Resp:   22 20  Height:      Weight:  226 lb 3.1 oz (102.6 kg)    SpO2: 100%   100%   Weight change: -3 lb 3.3 oz (-1.455 kg)  Intake/Output Summary (Last 24 hours) at 09/13/14 0806 Last data filed at 09/12/14 1800  Gross per 24 hour  Intake    680 ml  Output   1300 ml  Net   -620 ml    General: Alert, awake, oriented x3, in no acute distress, lying in bed Heart: Regular rate and rhythm, without murmurs- 2/6 systolic murmur heard in aortic area- Normal on Echo which was done after Cath, no rubs, gallops.  Abd- Not tender, soft, bowel sounds heard Lungs: Clear to auscultation.  No rales or wheezes. Exemities:  No edema., bilat radial pulses present, no swelling or tenderness in right femoral area Neuro: Grossly intact, nonfocal.  Tele:  SR Lab Results: Results for orders placed or performed during the hospital encounter of 09/11/14 (from the past 24 hour(s))  POCT Activated clotting time     Status: None   Collection Time: 09/12/14  8:34 AM  Result Value Ref Range   Activated Clotting Time 411 seconds  MRSA PCR Screening     Status: None   Collection Time: 09/12/14 10:11 AM  Result Value Ref Range   MRSA by PCR NEGATIVE NEGATIVE  Troponin I     Status: Abnormal   Collection Time: 09/12/14 11:40 AM  Result Value Ref Range   Troponin I 25.27 (HH) <0.031 ng/mL  CBC     Status: Abnormal   Collection Time: 09/12/14 11:40 AM  Result Value Ref Range   WBC 8.5 4.0 -  10.5 K/uL   RBC 4.07 3.87 - 5.11 MIL/uL   Hemoglobin 8.3 (L) 12.0 - 15.0 g/dL   HCT 27.9 (L) 36.0 - 46.0 %   MCV 68.6 (L) 78.0 - 100.0 fL   MCH 20.4 (L) 26.0 - 34.0 pg   MCHC 29.7 (L) 30.0 - 36.0 g/dL   RDW 24.0 (H) 11.5 - 15.5 %   Platelets 380 150 - 400 K/uL  Creatinine, serum     Status: None   Collection Time: 09/12/14 11:40 AM  Result Value Ref Range   Creatinine, Ser 0.66 0.44 - 1.00 mg/dL   GFR calc non Af Amer >60 >60 mL/min   GFR calc Af Amer >60 >60 mL/min  Urine rapid drug screen (hosp performed)     Status: Abnormal   Collection Time: 09/12/14  6:24 PM  Result Value Ref Range   Opiates NONE DETECTED NONE DETECTED   Cocaine NONE DETECTED NONE DETECTED   Benzodiazepines POSITIVE (A) NONE DETECTED   Amphetamines NONE DETECTED NONE DETECTED   Tetrahydrocannabinol NONE DETECTED NONE DETECTED   Barbiturates NONE DETECTED NONE DETECTED  Basic metabolic panel     Status: Abnormal  Collection Time: 09/13/14  3:12 AM  Result Value Ref Range   Sodium 139 135 - 145 mmol/L   Potassium 3.8 3.5 - 5.1 mmol/L   Chloride 106 101 - 111 mmol/L   CO2 23 22 - 32 mmol/L   Glucose, Bld 68 65 - 99 mg/dL   BUN 5 (L) 6 - 20 mg/dL   Creatinine, Ser 0.69 0.44 - 1.00 mg/dL   Calcium 8.7 (L) 8.9 - 10.3 mg/dL   GFR calc non Af Amer >60 >60 mL/min   GFR calc Af Amer >60 >60 mL/min   Anion gap 10 5 - 15  CBC     Status: Abnormal   Collection Time: 09/13/14  3:12 AM  Result Value Ref Range   WBC 7.8 4.0 - 10.5 K/uL   RBC 4.04 3.87 - 5.11 MIL/uL   Hemoglobin 8.4 (L) 12.0 - 15.0 g/dL   HCT 27.8 (L) 36.0 - 46.0 %   MCV 68.8 (L) 78.0 - 100.0 fL   MCH 20.8 (L) 26.0 - 34.0 pg   MCHC 30.2 30.0 - 36.0 g/dL   RDW 24.1 (H) 11.5 - 15.5 %   Platelets 356 150 - 400 K/uL    Studies/Results: No results found.  Ekg- 09/13/2014- shows- TWI in V2 and V3 and inferior leads, T wave flattening V4- V5, minor but not significant change from prior.  Medications  Reviewed  1. NSTEMI- Cardiac Cath revealed  extensive thrombus with 100% occlusion of her RCA- Aspiration thrombectomy was done with overlapping DES was placed. Risk factors for CAD- Smoking and Obesity. HgbA1c- 5.7, Lipid panel- LDL- 86, HDL- 29. No family hx. She took Phentermine for only 2 weeks, this was prescribed a bout 1 year ago. ECHO- EF- 50-55% without wall motion abnormalities, mild mitral regurg, normal Aortic valve. - On aspirin- 81mg  daily - On plavix- chosen due to presence of anemia due to heavy menstrual bleeds, requiring transfusions in the past. - Cont lipitor- 80mg  daily - Cont metop 12.5mg  BID - Pt counselled about quitting smoking.  - Kidney function good so far, consider starting low dose ACE-i in the am - Transfer to Tele - Cardiac rehab- pt was nauseous so they will come back to work with pt. - Pt counselled about the importance of been complaint with therapy.  2. Anemia due to Menorrhagia- Pt counselled this will have to be addressed by a gynae, as her bleeding will increase while on DUAP. OCPs or hormonal meds are contraindicated in CAD. Pt voiced understanding. Hgb today- 8.4, 9.4 on admission. - Start Fe suppliment- 325mg  daily. - Called gynae clinic to schedule appointment but clinic today is a half day and they do not resume till next Tuesday. Since pt will likely be discharged tomorrow, will have to be arranged next week, also discharge summary should be sent to PCP about needed Gynae follow up so that this does not fall through the cracks.  3. Thrombocytosis- now normal- 356 today, 557 on admission likely reactive thrombocytosis in setting or anemia or acute stress.   Bing Neighbors PGY-2, IMTS- Resident on CCU service. 11:20 AM 09/13/2014    @PROBHOSP @  1  CAD  Patient s/p Cath LAD, LCx normal  RCA with 100% stensosi with thrombus  S/p thrombectomy with overlapping DES  Echo yesterday shoaws LVEF 50 to 55%  Mild MR     Plan for ASA/PLAVX for at least 12 months.  Plavix chosen due to menses/anemia      LOS: 1 day  Dorris Carnes 09/13/2014, 8:06 AM  Patient seen and examined  I agree with findings of E Ejiro above Patient s/p IWMI with DES x 2 to RCA (RCA appeared to be develping over time as she had collaterals from left) WIll need ASA and plavix x 1 year Difficult as pt has heavy menses.  Needs to est with a gyn who can follow closely  She is not a candidate for hormone Rx Place on Fe low dose, again so that she will tolerate and follow H/H closely.  Ptinet nauseated  Have tried Reglan.  I am not convinced related to cardiac as she is not havng other symptoms.   WIll follow  Continue lipitor  Counselled on tob  Needs to stop.  I am really worried about compliance with this pt.    Dorris Carnes

## 2014-09-14 DIAGNOSIS — N920 Excessive and frequent menstruation with regular cycle: Secondary | ICD-10-CM | POA: Diagnosis present

## 2014-09-14 DIAGNOSIS — R001 Bradycardia, unspecified: Secondary | ICD-10-CM | POA: Diagnosis present

## 2014-09-14 LAB — CBC
HCT: 27.4 % — ABNORMAL LOW (ref 36.0–46.0)
Hemoglobin: 8.4 g/dL — ABNORMAL LOW (ref 12.0–15.0)
MCH: 20.7 pg — AB (ref 26.0–34.0)
MCHC: 30.7 g/dL (ref 30.0–36.0)
MCV: 67.7 fL — ABNORMAL LOW (ref 78.0–100.0)
Platelets: 346 10*3/uL (ref 150–400)
RBC: 4.05 MIL/uL (ref 3.87–5.11)
RDW: 23.4 % — ABNORMAL HIGH (ref 11.5–15.5)
WBC: 7.1 10*3/uL (ref 4.0–10.5)

## 2014-09-14 NOTE — Progress Notes (Signed)
Pt very concerned that her menses began again after cath today, pt reports no pain or cramping and menses contains no clots but it is "trickling down", pt reports that last menses began on 09/01/14 and lasted about 7 days, pt concerned that she is feeling very tired, weak, and dizzy, last Hgb this am was 8.4, MD notified, MD ordered CBC, recent Hgb was 7.9, MD made aware, no new orders placed, RN will continue to monitor, call bell within reach and bed in lowest position.   Terry Morgan, [place] 09/14/2014

## 2014-09-14 NOTE — Progress Notes (Signed)
Pt states, "I am feeling much better".  Pt c/o some nausea still, unable to take pills and keep them down.  Hgb is better, but pt has been unable to take po iron as of yet.  Pt was educated on anemia and was instructed on how to take iron when she is home.  BP much better since this morning as well.   Orthostatics were negative.  Will continue to monitor.

## 2014-09-14 NOTE — Progress Notes (Signed)
1048- Pt declined to walk at this time. Still nauseated, pt states she just threw up a little. Will inform pt's RN. Sol Passer, MS, ACSM CCEP

## 2014-09-14 NOTE — Progress Notes (Signed)
PHASE I Cardiac Rehab  Pt refused ambulation, c/o nausea and light headed with sitting up.  Pt was tolerant of MI education, including stents, medication compliance, exercise, diet and lifestyle modifications.  Smoking cessation counseling provided.  Pt instructed to call [phone].  Pt seems ready to quit.  Pt verbalizes commitment for desire to make lifestyle modifications for risk factor reduction. Pt oriented in outpatient CRPII.  At pt request, referral will be sent to [facility].  Pt questions answered.     Pt offered emotional support and reassurance.

## 2014-09-14 NOTE — Progress Notes (Signed)
SUBJECTIVE:  Patient still has some nausea. She is not eating fully yet. In addition, she is still having some vaginal bleeding. Hemoglobin is down to 7.9.   Filed Vitals:   09/13/14 2030 09/13/14 2138 09/14/14 0500 09/14/14 0528  BP: 112/63   107/73  Pulse: 56 55  61  Temp: 98.9 F (37.2 C)   98.8 F (37.1 C)  TempSrc: Oral   Oral  Resp: 20   18  Height:      Weight:   223 lb 15.8 oz (101.6 kg)   SpO2: 100%   98%     Intake/Output Summary (Last 24 hours) at 09/14/14 0844 Last data filed at 09/13/14 1349  Gross per 24 hour  Intake    760 ml  Output    150 ml  Net    610 ml    LABS: Basic Metabolic Panel:  Recent Labs  09/12/14 0305 09/12/14 1140 09/13/14 0312  NA 139  --  139  K 3.9  --  3.8  CL 107  --  106  CO2 24  --  23  GLUCOSE 106*  --  68  BUN 6  --  5*  CREATININE 0.73 0.66 0.69  CALCIUM 9.1  --  8.7*  MG 1.8  --   --    Liver Function Tests:  Recent Labs  09/12/14 0305  AST 61*  ALT 21  ALKPHOS 60  BILITOT 0.4  PROT 7.4  ALBUMIN 3.7   No results for input(s): LIPASE, AMYLASE in the last 72 hours. CBC:  Recent Labs  09/11/14 1851  09/13/14 0312 09/13/14 2215  WBC 8.0  < > 7.8 6.2  NEUTROABS 4.3  --   --   --   HGB 9.4*  < > 8.4* 7.9*  HCT 31.7*  < > 27.8* 26.5*  MCV 68.5*  < > 68.8* 67.6*  PLT 557*  < > 356 335  < > = values in this interval not displayed. Cardiac Enzymes:  Recent Labs  09/11/14 1850 09/12/14 0305 09/12/14 1140  TROPONINI 1.00* 8.52* 25.27*   BNP: Invalid input(s): POCBNP D-Dimer: No results for input(s): DDIMER in the last 72 hours. Hemoglobin A1C:  Recent Labs  09/12/14 0305  HGBA1C 5.7*   Fasting Lipid Panel:  Recent Labs  09/12/14 0305  CHOL 127  HDL 29*  LDLCALC 86  TRIG 62  CHOLHDL 4.4   Thyroid Function Tests:  Recent Labs  09/12/14 0305  TSH 0.495    RADIOLOGY: Dg Chest 2 View  09/11/2014   CLINICAL DATA:  Centralized chest pain shortness of breath for 1 week. History  of smoking.  EXAM: CHEST  2 VIEW  COMPARISON:  None.  FINDINGS: Borderline enlarged cardiac silhouette. Normal mediastinal contours. Minimal bilateral infrahilar heterogeneous opacities favored to represent atelectasis. No discrete focal airspace opacities. There is mild diffuse slightly nodular thickening of the pulmonary interstitium. There is minimal pleural parenchymal thickening about the right minor fissure. No pleural effusion or pneumothorax. No evidence of edema. No acute osseus abnormalities.  IMPRESSION: Findings suggestive of airways disease / bronchitis. No focal airspace opacities to suggest pneumonia.   Electronically Signed   By: Sandi Mariscal M.D.   On: 09/11/2014 20:42   Ct Angio Chest Pe W/cm &/or Wo Cm  09/11/2014   CLINICAL DATA:  Initial UA shin for acute chest tightness, shortness of breath.  EXAM: CT ANGIOGRAPHY CHEST WITH CONTRAST  TECHNIQUE: Multidetector CT imaging of the chest was performed using  the standard protocol during bolus administration of intravenous contrast. Multiplanar CT image reconstructions and MIPs were obtained to evaluate the vascular anatomy.  CONTRAST:  14mL OMNIPAQUE IOHEXOL 350 MG/ML SOLN  COMPARISON:  Prior radiograph from earlier the same day  FINDINGS: Visualized thyroid gland is within normal limits. Left thyroid lobe is extends inferiorly towards the substernal region. No pathologically enlarged mediastinal, hilar, or axillary lymph nodes identified.  Intrathoracic aorta of normal caliber and appearance. Great vessels within normal limits.  Heart size normal.  No pericardial effusion.  Pulmonary arterial tree is adequately opacified for evaluation. No filling defect to suggest acute pulmonary embolism identified. Re-formatted imaging confirms these findings.  Lungs are clear without focal infiltrate or pulmonary edema. No pleural effusion. No pneumothorax. No worrisome pulmonary nodule or mass. Mild subsegmental atelectasis seen dependently within the lower  lobes bilaterally.  Visualized portions of the upper abdomen are unremarkable.  No acute osseous abnormality. No worrisome lytic or blastic osseous lesions.  IMPRESSION: 1. No CT evidence for acute pulmonary embolism. 2. No other acute cardiopulmonary process identified.   Electronically Signed   By: Jeannine Boga M.D.   On: 09/11/2014 22:37    PHYSICAL EXAM  patient is lying flat in bed. She is oriented to person time and place. Affect is normal. Lungs are clear. Respiratory effort is nonlabored. Cardiac exam reveals S1 and S2. There is no significant peripheral edema.   TELEMETRY: I have reviewed telemetry today Sep 14, 2014. There is normal sinus rhythm with sinus bradycardia.   ASSESSMENT AND PLAN:    NSTEMI (non-ST elevated myocardial infarction)     This was stented and the patient is on aspirin and Plavix. Unfortunately she continues to have vaginal bleeding.    Anemia    Hemoglobin is down to 7.9. She cannot be discharged with her hemoglobin dropping. Plan to repeat CBC tomorrow. We may need to proceed with transfusion. Also, will need to check if IV iron would be recommended in this situation. Also, GYN evaluation was to be planned as an outpatient next Tuesday. We may have to insist on in-hospital GYN evaluation.    Thrombocytosis    Menorrhagia    As I have noted above, we may have to push for more aggressive evaluation of this issue in the hospital.   Dola Argyle 09/14/2014 8:44 AM

## 2014-09-14 NOTE — Progress Notes (Signed)
I rounded on this pt at 1015 this morning, but I was pulled out of room by charge nurse, as pt had given the charge nurse a phone call with step-sister on phone at 1045.  Pt stepsister was shouting and very angry saying we were not doing anything for the pt.  The pt had had nausea/vomiting and had just had zofran.  Stepsister was upset that we hadn't given the pt phenergan.  Pt was offered phenergan at 1000, and refused.  Even while pt's family was complaining, pt stated "I do not want anymore medicine today, it is making me weaker and feel worse."  Pt is still unable to eat a full meal.  Pt stated, "I would not really want fluid either unless it was critical that I get it.  Since they say I'm stable right now, I just want to be allowed to sleep today." Pt's Hgb has increased to 8.4 this am.  Pt stated she had no questions and felt she was getting good car, and did not want anything at this time.  She stated that her relative had just gotten angry for no reason and wasn't listening to her.  Pt states she has no further needs at this time.

## 2014-09-15 ENCOUNTER — Inpatient Hospital Stay (HOSPITAL_COMMUNITY): Payer: Medicaid Other

## 2014-09-15 DIAGNOSIS — R112 Nausea with vomiting, unspecified: Secondary | ICD-10-CM | POA: Diagnosis present

## 2014-09-15 DIAGNOSIS — K5900 Constipation, unspecified: Secondary | ICD-10-CM

## 2014-09-15 DIAGNOSIS — R1310 Dysphagia, unspecified: Secondary | ICD-10-CM

## 2014-09-15 DIAGNOSIS — K219 Gastro-esophageal reflux disease without esophagitis: Secondary | ICD-10-CM | POA: Diagnosis present

## 2014-09-15 DIAGNOSIS — R110 Nausea: Secondary | ICD-10-CM

## 2014-09-15 LAB — URINALYSIS, ROUTINE W REFLEX MICROSCOPIC
Bilirubin Urine: NEGATIVE
Glucose, UA: NEGATIVE mg/dL
Ketones, ur: NEGATIVE mg/dL
Leukocytes, UA: NEGATIVE
NITRITE: NEGATIVE
PH: 6.5 (ref 5.0–8.0)
Protein, ur: NEGATIVE mg/dL
Specific Gravity, Urine: 1.006 (ref 1.005–1.030)
Urobilinogen, UA: 1 mg/dL (ref 0.0–1.0)

## 2014-09-15 LAB — IRON AND TIBC
Iron: 35 ug/dL (ref 28–170)
Saturation Ratios: 7 % — ABNORMAL LOW (ref 10.4–31.8)
TIBC: 469 ug/dL — ABNORMAL HIGH (ref 250–450)
UIBC: 434 ug/dL

## 2014-09-15 LAB — URINE MICROSCOPIC-ADD ON

## 2014-09-15 LAB — VITAMIN B12: VITAMIN B 12: 379 pg/mL (ref 180–914)

## 2014-09-15 LAB — RETICULOCYTES
RBC.: 4.3 MIL/uL (ref 3.87–5.11)
RETIC CT PCT: 2.1 % (ref 0.4–3.1)
Retic Count, Absolute: 90.3 10*3/uL (ref 19.0–186.0)

## 2014-09-15 LAB — FERRITIN: Ferritin: 3 ng/mL — ABNORMAL LOW (ref 11–307)

## 2014-09-15 LAB — FOLATE: Folate: 13 ng/mL (ref >5.9)

## 2014-09-15 MED ORDER — SODIUM CHLORIDE 0.9 % IV SOLN
INTRAVENOUS | Status: DC
Start: 2014-09-15 — End: 2014-09-16
  Administered 2014-09-15 (×2): via INTRAVENOUS

## 2014-09-15 MED ORDER — BISACODYL 10 MG RE SUPP
10.0000 mg | Freq: Two times a day (BID) | RECTAL | Status: AC
  Filled 2014-09-15: qty 1

## 2014-09-15 MED ORDER — MAGNESIUM CITRATE PO SOLN
1.0000 | Freq: Once | ORAL | Status: DC
  Filled 2014-09-15: qty 296

## 2014-09-15 MED ORDER — PANTOPRAZOLE SODIUM 40 MG IV SOLR
40.0000 mg | Freq: Two times a day (BID) | INTRAVENOUS | Status: DC
  Administered 2014-09-15 (×2): 40 mg via INTRAVENOUS
  Filled 2014-09-15 (×4): qty 40

## 2014-09-15 MED ORDER — POLYETHYLENE GLYCOL 3350 17 G PO PACK
17.0000 g | PACK | Freq: Two times a day (BID) | ORAL | Status: DC
  Administered 2014-09-15: 17 g via ORAL
  Filled 2014-09-15 (×4): qty 1

## 2014-09-15 NOTE — H&P (Signed)
[facility] Consultation note                                                                                    Terry Morgan, is a 34 y.o. female  MRN: [id number]   DOB - Mar 06, 1981  Admit Date - 09/11/2014  Outpatient Primary MD for the patient is No PCP Per Patient  Referring MD: [facility]/ Cardiology  With History of -  Past Medical History  Diagnosis Date  . Blood transfusion without reported diagnosis       Past Surgical History  Procedure Laterality Date  . Cesarean section    . Cardiac catheterization N/A 09/12/2014    Procedure: Left Heart Cath and Coronary Angiography;  Surgeon: Sherren Mocha, MD;  Location: [facility];  Service: Cardiovascular;  Laterality: N/A;  . Cardiac catheterization N/A 09/12/2014    Procedure: Coronary Stent Intervention;  Surgeon: Sherren Mocha, MD;  Location: [facility];  Service: Cardiovascular;  Laterality: N/A;  aspiration thrombectomy    in for   Chief Complaint  Patient presents with  . 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 Cath revealed extensive thrombus with 100% occlusion of RCA and she underwent aspiration thrombectomy and drug-eluting stent placement. Since admission patient has been troubled by persistent issues of nausea and occasional emesis. She has had no improvement in her symptoms with utilization of typical anti-medic therapies and consultation has been requested.  In discussing with the patient she does not have a history of reflux or ulcers. She noted on the day of admission that she initially was nothing by mouth for a period of time and when she finally attempted to eat immediately after eating she developed emesis. She is continuing to have issues with nausea with eating or drinking and today developed  indigestion type symptoms and mid chest discomfort with eating. She also had some lower suprapubic discomfort and has developed some dry heaves. She denies any blood in her stool and vomit either before this admission or since admission. She is continuing to have vaginal bleeding since she is on her period although she had just completed. Several weeks ago and notes this period is early. Her hemoglobin is remaining stable although her blood pressure seems a little soft. During this admission she has been started on aspirin, Plavix, Lipitor and metoprolol. Because of her nausea she has been refusing oral medications including oral iron supplementation. An anemia panel has not been checked.   Review of Systems   In addition to the HPI above,  No Fever-chills, myalgias or other constitutional symptoms No Headache, changes with Vision or hearing, new weakness, tingling, numbness in any extremity, No Cough or Shortness of Breath, palpitations, orthopnea or DOE No melena or hematochezia, no dark tarry stools, Bowel movements are regular, No dysuria, hematuria or flank pain No new skin rashes, lesions, masses or bruises, No new joints pains-aches No recent weight gain or loss No polyuria, polydypsia or polyphagia,  *  A full 10 point Review of Systems was done, except as stated above, all other Review of Systems were negative.  Social History History  Substance Use Topics  . Smoking status: Current Every Day Smoker -- 1.00 packs/day for 12 years    Types: Cigarettes  . Smokeless tobacco: Not on file  . Alcohol Use: No    Resides at: Private residence  Lives with: Alone  Ambulatory status: w/o cystic devices prior to admission   Family History Grandmother: Deceased at advanced age from cardiac problems   Prior to Admission medications   Not on File    No Known Allergies  Physical Exam  Vitals  Blood pressure 117/64, pulse 78, temperature 98.8 F (37.1 C), temperature source  Oral, resp. rate 18, height 5' (1.524 m), weight 222 lb 8 oz (100.925 kg), last menstrual period 09/04/2014, SpO2 100 %.   General:  In no acute distress, appears healthy and well nourished  Psych:  Normal affect, Denies Suicidal or Homicidal ideations, Awake Alert, Oriented X 3. Speech and thought patterns are clear and appropriate, no apparent short term memory deficits  Neuro:   No focal neurological deficits, CN II through XII intact, Strength 5/5 all 4 extremities, Sensation intact all 4 extremities.  ENT:  Ears and Eyes appear Normal, Conjunctivae clear, PER. Moist oral mucosa without erythema or exudates.  Neck:  Supple, No lymphadenopathy appreciated  Respiratory:  Symmetrical chest wall movement, Good air movement bilaterally, CTAB. Room Air  Cardiac:  RRR, No Murmurs, no LE edema noted, no JVD, No carotid bruits, peripheral pulses palpable at 2+  Abdomen:  Positive bowel sounds, Soft, Non tender, Non distended,  No masses appreciated, no obvious hepatosplenomegaly  Skin:  No Cyanosis, Normal Skin Turgor, No Skin Rash or Bruise.  Extremities: Symmetrical without obvious trauma or injury,  no effusions.  Data Review  CBC  Recent Labs Lab 09/11/14 1851 09/12/14 0305 09/12/14 1140 09/13/14 0312 09/13/14 2215 09/14/14 1015  WBC 8.0 9.1 8.5 7.8 6.2 7.1  HGB 9.4* 9.0* 8.3* 8.4* 7.9* 8.4*  HCT 31.7* 30.9* 27.9* 27.8* 26.5* 27.4*  PLT 557* 508* 380 356 335 346  MCV 68.5* 68.2* 68.6* 68.8* 67.6* 67.7*  MCH 20.3* 19.9* 20.4* 20.8* 20.2* 20.7*  MCHC 29.7* 29.1* 29.7* 30.2 29.8* 30.7  RDW 24.8* 24.7* 24.0* 24.1* 23.6* 23.4*  LYMPHSABS 2.6  --   --   --   --   --   MONOABS 0.6  --   --   --   --   --   EOSABS 0.4  --   --   --   --   --   BASOSABS 0.1  --   --   --   --   --     Chemistries   Recent Labs Lab 09/11/14 1850 09/12/14 0305 09/12/14 1140 09/13/14 0312  NA 135 139  --  139  K 4.0 3.9  --  3.8  CL 104 107  --  106  CO2 22 24  --  23  GLUCOSE 100*  106*  --  68  BUN 8 6  --  5*  CREATININE 0.70 0.73 0.66 0.69  CALCIUM 9.2 9.1  --  8.7*  MG  --  1.8  --   --   AST  --  61*  --   --   ALT  --  21  --   --   ALKPHOS  --  60  --   --  BILITOT  --  0.4  --   --     estimated creatinine clearance is 105.9 mL/min (by C-G formula based on Cr of 0.69).  No results for input(s): TSH, T4TOTAL, T3FREE, THYROIDAB in the last 72 hours.  Invalid input(s): FREET3  Coagulation profile No results for input(s): INR, PROTIME in the last 168 hours.  No results for input(s): DDIMER in the last 72 hours.  Cardiac Enzymes  Recent Labs Lab 09/11/14 1850 09/12/14 0305 09/12/14 1140  TROPONINI 1.00* 8.52* 25.27*    Invalid input(s): POCBNP  Urinalysis    Component Value Date/Time   COLORURINE YELLOW 09/22/2011 [place] 09/22/2011 1804   LABSPEC 1.021 09/22/2011 1804   PHURINE 5.5 09/22/2011 1804   GLUCOSEU NEGATIVE 09/22/2011 1804   HGBUR NEGATIVE 09/22/2011 1804   BILIRUBINUR NEGATIVE 09/22/2011 1804   KETONESUR NEGATIVE 09/22/2011 1804   PROTEINUR NEGATIVE 09/22/2011 1804   UROBILINOGEN 0.2 09/22/2011 1804   NITRITE NEGATIVE 09/22/2011 1804   LEUKOCYTESUR NEGATIVE 09/22/2011 1804    Imaging results:   Dg Chest 2 View  09/11/2014   CLINICAL DATA:  Centralized chest pain shortness of breath for 1 week. History of smoking.  EXAM: CHEST  2 VIEW  COMPARISON:  None.  FINDINGS: Borderline enlarged cardiac silhouette. Normal mediastinal contours. Minimal bilateral infrahilar heterogeneous opacities favored to represent atelectasis. No discrete focal airspace opacities. There is mild diffuse slightly nodular thickening of the pulmonary interstitium. There is minimal pleural parenchymal thickening about the right minor fissure. No pleural effusion or pneumothorax. No evidence of edema. No acute osseus abnormalities.  IMPRESSION: Findings suggestive of airways disease / bronchitis. No focal airspace opacities to suggest  pneumonia.   Electronically Signed   By: Sandi Mariscal M.D.   On: 09/11/2014 20:42   Ct Angio Chest Pe W/cm &/or Wo Cm  09/11/2014   CLINICAL DATA:  Initial UA shin for acute chest tightness, shortness of breath.  EXAM: CT ANGIOGRAPHY CHEST WITH CONTRAST  TECHNIQUE: Multidetector CT imaging of the chest was performed using the standard protocol during bolus administration of intravenous contrast. Multiplanar CT image reconstructions and MIPs were obtained to evaluate the vascular anatomy.  CONTRAST:  132mL OMNIPAQUE IOHEXOL 350 MG/ML SOLN  COMPARISON:  Prior radiograph from earlier the same day  FINDINGS: Visualized thyroid gland is within normal limits. Left thyroid lobe is extends inferiorly towards the substernal region. No pathologically enlarged mediastinal, hilar, or axillary lymph nodes identified.  Intrathoracic aorta of normal caliber and appearance. Great vessels within normal limits.  Heart size normal.  No pericardial effusion.  Pulmonary arterial tree is adequately opacified for evaluation. No filling defect to suggest acute pulmonary embolism identified. Re-formatted imaging confirms these findings.  Lungs are clear without focal infiltrate or pulmonary edema. No pleural effusion. No pneumothorax. No worrisome pulmonary nodule or mass. Mild subsegmental atelectasis seen dependently within the lower lobes bilaterally.  Visualized portions of the upper abdomen are unremarkable.  No acute osseous abnormality. No worrisome lytic or blastic osseous lesions.  IMPRESSION: 1. No CT evidence for acute pulmonary embolism. 2. No other acute cardiopulmonary process identified.   Electronically Signed   By: Jeannine Boga M.D.   On: 09/11/2014 22:37      Assessment & Plan  Principal Problem:   Nausea & vomiting/odynophagia/?? GERD/ ?? Constipation -Patient recently reporting constipated like stools so check abdominal series to rule out significant constipation -Patient states Zofran and Phenergan  ineffective so we'll discontinue; we'll also discontinue oral and IV  narcotics as well as oral iron -Upper GI series in a.m. -Begin twice a day Protonix -Check orthostatic vital signs in the event with patient's underlying chronic anemia she is now developed orthostasis in setting of addition of beta blockers this admission -Check serum H. pylori antibody -Decrease diet to clears and add low rate IV fluids (EF 50-55%)-since having suprapubic pain and nausea will check urinalysis and culture to rule out UTI  Active Problems:   Anemia/Thrombocytosis -Hemoglobin baseline around 9.2, current hemoglobin only slightly less than that -Thrombocytosis likely appropriate reaction to chronic anemia -Check anemia panel -TSH was normal this admission -Attempt to check stools for blood noting may not be accurate in setting of ongoing menses    NSTEMI s/p stent to RCA -Her primary team    Menorrhagia -Recommend pursue outpatient evaluation with primary GYN     Time spent in minutes : 60      ELLIS,ALLISON L. ANP on 09/15/2014 at 12:28 PM  Between 7am to 7pm - Pager - [phone]  After 7pm go to [email or web address] - password [email or web address]  And look for the night coverage person covering me after hours  [facility]

## 2014-09-15 NOTE — Progress Notes (Addendum)
SUBJECTIVE: The patient's cardiac status is stable. Unfortunately, she continues to have intermittent nausea. She is refusing many of her medications. The exact etiology of her nausea is not clear to me.   Filed Vitals:   09/14/14 0528 09/14/14 1425 09/14/14 2025 09/15/14 0432  BP: 107/73 144/82 123/82 117/64  Pulse: 61 76 63 78  Temp: 98.8 F (37.1 C) 97.8 F (36.6 C) 99.3 F (37.4 C) 98.8 F (37.1 C)  TempSrc: Oral Axillary Oral Oral  Resp: 18 17 19 18   Height:      Weight:    222 lb 8 oz (100.925 kg)  SpO2: 98% 98% 100% 100%     Intake/Output Summary (Last 24 hours) at 09/15/14 1047 Last data filed at 09/14/14 2130  Gross per 24 hour  Intake    390 ml  Output      0 ml  Net    390 ml    LABS: Basic Metabolic Panel:  Recent Labs  09/12/14 1140 09/13/14 0312  NA  --  139  K  --  3.8  CL  --  106  CO2  --  23  GLUCOSE  --  68  BUN  --  5*  CREATININE 0.66 0.69  CALCIUM  --  8.7*   Liver Function Tests: No results for input(s): AST, ALT, ALKPHOS, BILITOT, PROT, ALBUMIN in the last 72 hours. No results for input(s): LIPASE, AMYLASE in the last 72 hours. CBC:  Recent Labs  09/13/14 2215 09/14/14 1015  WBC 6.2 7.1  HGB 7.9* 8.4*  HCT 26.5* 27.4*  MCV 67.6* 67.7*  PLT 335 346   Cardiac Enzymes:  Recent Labs  09/12/14 1140  TROPONINI 25.27*   BNP: Invalid input(s): POCBNP D-Dimer: No results for input(s): DDIMER in the last 72 hours. Hemoglobin A1C: No results for input(s): HGBA1C in the last 72 hours. Fasting Lipid Panel: No results for input(s): CHOL, HDL, LDLCALC, TRIG, CHOLHDL, LDLDIRECT in the last 72 hours. Thyroid Function Tests: No results for input(s): TSH, T4TOTAL, T3FREE, THYROIDAB in the last 72 hours.  Invalid input(s): FREET3  RADIOLOGY: Dg Chest 2 View  09/11/2014   CLINICAL DATA:  Centralized chest pain shortness of breath for 1 week. History of smoking.  EXAM: CHEST  2 VIEW  COMPARISON:  None.  FINDINGS: Borderline  enlarged cardiac silhouette. Normal mediastinal contours. Minimal bilateral infrahilar heterogeneous opacities favored to represent atelectasis. No discrete focal airspace opacities. There is mild diffuse slightly nodular thickening of the pulmonary interstitium. There is minimal pleural parenchymal thickening about the right minor fissure. No pleural effusion or pneumothorax. No evidence of edema. No acute osseus abnormalities.  IMPRESSION: Findings suggestive of airways disease / bronchitis. No focal airspace opacities to suggest pneumonia.   Electronically Signed   By: Sandi Mariscal M.D.   On: 09/11/2014 20:42   Ct Angio Chest Pe W/cm &/or Wo Cm  09/11/2014   CLINICAL DATA:  Initial UA shin for acute chest tightness, shortness of breath.  EXAM: CT ANGIOGRAPHY CHEST WITH CONTRAST  TECHNIQUE: Multidetector CT imaging of the chest was performed using the standard protocol during bolus administration of intravenous contrast. Multiplanar CT image reconstructions and MIPs were obtained to evaluate the vascular anatomy.  CONTRAST:  121mL OMNIPAQUE IOHEXOL 350 MG/ML SOLN  COMPARISON:  Prior radiograph from earlier the same day  FINDINGS: Visualized thyroid gland is within normal limits. Left thyroid lobe is extends inferiorly towards the substernal region. No pathologically enlarged mediastinal, hilar, or axillary lymph nodes  identified.  Intrathoracic aorta of normal caliber and appearance. Great vessels within normal limits.  Heart size normal.  No pericardial effusion.  Pulmonary arterial tree is adequately opacified for evaluation. No filling defect to suggest acute pulmonary embolism identified. Re-formatted imaging confirms these findings.  Lungs are clear without focal infiltrate or pulmonary edema. No pleural effusion. No pneumothorax. No worrisome pulmonary nodule or mass. Mild subsegmental atelectasis seen dependently within the lower lobes bilaterally.  Visualized portions of the upper abdomen are  unremarkable.  No acute osseous abnormality. No worrisome lytic or blastic osseous lesions.  IMPRESSION: 1. No CT evidence for acute pulmonary embolism. 2. No other acute cardiopulmonary process identified.   Electronically Signed   By: Jeannine Boga M.D.   On: 09/11/2014 22:37    PHYSICAL EXAM  patient is lying comfortably flat in bed. She is oriented to person time and place. Affect is normal. Lungs are clear. Respiratory effort is unlabored. Cardiac exam reveals an S1 and S2. The abdomen is soft. There is no peripheral edema.   TELEMETRY: I have reviewed telemetry today Sep 15, 2014. There is normal sinus rhythm.   ASSESSMENT AND PLAN:    NSTEMI (non-ST elevated myocardial infarction)      Patient was treated with a drug-eluting stent. She definitely needs to be on dual antiplatelets therapy.      Anemia   Thrombocytosis   Menorrhagia     Fortunately her hemoglobin is more stable at this point. No major bleeding from her vagina at this time.    Nausea    Unfortunately she has persistent nausea. Is very difficult to decide how to evaluate this further. She is refusing some of her medications. I will ask internal medicine to help assess this problem. Unfortunately will be very difficult to change the medications that she needs for her coronary stent. These include aspirin and Plavix. She would be very high risk to stop either her aspirin or her Plavix. Plavix could be switched to Brilinta. However this would increase her bleeding risk from her menorrhagia even further.   Dola Argyle 09/15/2014 10:47 AM

## 2014-09-16 ENCOUNTER — Encounter (HOSPITAL_COMMUNITY): Payer: Self-pay | Admitting: Physician Assistant

## 2014-09-16 DIAGNOSIS — E669 Obesity, unspecified: Secondary | ICD-10-CM | POA: Diagnosis present

## 2014-09-16 DIAGNOSIS — Z720 Tobacco use: Secondary | ICD-10-CM | POA: Diagnosis present

## 2014-09-16 DIAGNOSIS — I2510 Atherosclerotic heart disease of native coronary artery without angina pectoris: Secondary | ICD-10-CM | POA: Diagnosis present

## 2014-09-16 MED ORDER — CLOPIDOGREL BISULFATE 75 MG PO TABS: 75.0000 mg | ORAL_TABLET | Freq: Every day | ORAL | Status: DC

## 2014-09-16 MED ORDER — ASPIRIN 81 MG PO TBEC: 81.0000 mg | DELAYED_RELEASE_TABLET | Freq: Every day | ORAL | Status: DC

## 2014-09-16 MED ORDER — ATORVASTATIN CALCIUM 20 MG PO TABS
20.0000 mg | ORAL_TABLET | Freq: Every day | ORAL | Status: DC
  Filled 2014-09-16: qty 1

## 2014-09-16 MED ORDER — METOPROLOL TARTRATE 25 MG PO TABS: 12.5000 mg | ORAL_TABLET | Freq: Two times a day (BID) | ORAL | Status: DC

## 2014-09-16 MED ORDER — PANTOPRAZOLE SODIUM 40 MG PO TBEC: 40.0000 mg | DELAYED_RELEASE_TABLET | Freq: Two times a day (BID) | ORAL | Status: DC

## 2014-09-16 MED ORDER — NITROGLYCERIN 0.4 MG SL SUBL: 0.4000 mg | SUBLINGUAL_TABLET | SUBLINGUAL | Status: DC | PRN

## 2014-09-16 MED ORDER — PANTOPRAZOLE SODIUM 40 MG PO TBEC
40.0000 mg | DELAYED_RELEASE_TABLET | Freq: Two times a day (BID) | ORAL | Status: DC
  Administered 2014-09-16: 40 mg via ORAL
  Filled 2014-09-16: qty 1

## 2014-09-16 MED ORDER — ATORVASTATIN CALCIUM 20 MG PO TABS: 20.0000 mg | ORAL_TABLET | Freq: Every evening | ORAL | Status: DC

## 2014-09-16 NOTE — Progress Notes (Signed)
    Subjective:  Feeling better today. Nausea improved. No chest pain or shortness of breath  Objective:  Vital Signs in the last 24 hours: Temp:  [98.1 F (36.7 C)-98.6 F (37 C)] 98.6 F (37 C) (05/30 0619) Pulse Rate:  [66-87] 84 (05/30 0619) Resp:  [17-18] 18 (05/30 0619) BP: (119-137)/(76-86) 137/80 mmHg (05/30 0619) SpO2:  [100 %] 100 % (05/30 0619) Weight:  [222 lb 3.6 oz (100.8 kg)] 222 lb 3.6 oz (100.8 kg) (05/30 0619)  Intake/Output from previous day: 05/29 0701 - 05/30 0700 In: 39 [P.O.:420] Out: -   Physical Exam: Pt is alert and oriented, NAD HEENT: normal Neck: JVP - normal Lungs: CTA bilaterally CV: RRR without murmur or gallop Abd: soft, NT, Positive BS, no hepatomegaly Ext: no C/C/E, distal pulses intact and equal Skin: warm/dry no rash   Lab Results:  Recent Labs  09/13/14 2215 09/14/14 1015  WBC 6.2 7.1  HGB 7.9* 8.4*  PLT 335 346   No results for input(s): NA, K, CL, CO2, GLUCOSE, BUN, CREATININE in the last 72 hours. No results for input(s): TROPONINI in the last 72 hours.  Invalid input(s): CK, MB  Cardiac Studies: 2D Echo: Study Conclusions  - Left ventricle: The cavity size was mildly dilated. Wall thickness was normal. Systolic function was normal. The estimated ejection fraction was in the range of 50% to 55%. Wall motion was normal; there were no regional wall motion abnormalities. Left ventricular diastolic function parameters were normal. - Mitral valve: There was mild regurgitation.  Tele: Sinus rhythm, sinus brady  Assessment/Plan:  1. NSTEMI - Acute occlusion of the RCA - treated with PCI using a DES platform. Continue ASA and plavix, high-intensity statin, beta-blocker.  2. Anemia - stabilized. Chronic menorrhagia likely exacerbated by antiplatelet Rx.   3. Nausea - appreciate Internal Medicine evaluation. Med adjustments made. KUB negative.  Dispo: appears stable for dc. Reviewed importance of med  adherence, tobacco cessation, lifestyle modification. She does not like the idea of being on medications, but understands the importance of DAPT and risk reduction measures. Will reduce atorvastatin dose to 20 mg to try to improve side effect profile (total cholesterol 127). DC home this am and plan APP follow-up 1-2 weeks.   Sherren Mocha, M.D. 09/16/2014, 7:27 AM

## 2014-09-16 NOTE — Progress Notes (Signed)
Pt refuses to have ns at 63ml/hr to be restarted. Pt states "I just want to sleep". Cato Mulligan RN

## 2014-09-16 NOTE — Discharge Summary (Signed)
Discharge Summary   Patient ID: Terry Morgan MRN: [id number], DOB/AGE: 12-19-1980 34 y.o. Admit date: 09/11/2014 D/C date:     09/16/2014  Primary Care Provider: No PCP Per Patient Primary Cardiologist: Burt Knack  Primary Discharge Diagnoses:  1. NSTEMI due to acute occlusion of the RCA s/p aspiration thrombectomy and overlapping DES 2. Anemia 3. Chronic menorrhagia 4. Nausea 5. Tobacco abuse 6. Obesity Body mass index is 43.4 kg/(m^2). 7. Thrombocytosis, improved by D/C 8. GERD  Hospital Course: [name] is a 34 y/o F with history of heavy menses requiring transfusion previously, tobacco use ~ 1ppd x > 12 years, obesity who presented to [facility] with chest pain. The pain has been intermittent for the past 2 months with more frequently episodes over the last week leading up to admission. She characterized the pain as substernal with sharp and tight components. She had some associated shortness of breath and mild nausea. She has had similar symptoms before requiring transfusion when she had a caesarean section. A year ago she took phentermine for weight loss and had some chest pain after taking the medication - she stopped it after approximately 2 weeks - this was prescribed by her PCP. She has stable heavy menses ~ once per month lasting 7-8 days. She had mildly low blood pressure on arrival but poor PO intake/nausea earlier that day. She was chest pain free when first evaluated by cardiology. In the ER she had an elevated troponin and CTA was performed and negative for PE. ECG sinus bradycardia, early R-wave progression/transition, inferior Qs, nonspecific anterior and inferior ST changes. She was admitted and started on medical therapies for NSTEMI and made NPO in anticipation for cath the following day. Due to nausea the patient refused heparin drip overnight. The team was called urgently with a troponin of 8.5 the following morning, at which time the patient also endorsed return  of chest pain 9/10. EKG NSR with TWI V2-V6, minimal ST elevation in lead III, avF with nonspecific TW change in those leads. Due to ongoing CP, concerning EKG, rising troponin, and BP running on the softer side, emergent cath was performed. This showed total occlusion of the RCA with extensive thrombus. She had normal LM, LAD, Cx. She ultimately had successful PCI of the RCA with aspiration thrombectomy and overlapping DES. DAPT with ASA/Plavix was recommended for at least 12 months. Plavix was chosen over more potent antiplatelet drugs due to anemia and heavy menstrual bleeding. Peak troponin 25. She was strongly encouraged to follow up with OB GYN upon discharge from the hospital - already has an appointment this upcoming week. She will not be a candidate for hormone therapy due to her CAD. She was placed on iron but could not tolerate this due to nausea. 2D echo 09/12/14: mildly dilated LV, EF 50-55%, no RWMA, mild MR. During this admission she also remained nauseated - not clearly related to cardiac status; this preceded any medications that were started. Hcg was negative. UDS + for benzo only which she received during cath. She was intermittently refusing some of her medications due to nausea and was educated on the utmost importance of them. She was started on PPI. She was treated with laxatives. Serum H pylori was sent - still pending. KUB was unremarkable. Orthostatics were negative. She was treated with IV fluids with improvement. Today she feels better. Dr. Burt Knack has seen and examined the patient today and feels he is stable for discharge. She was asked to abstain from work  until cleared in follow-up - Dr. Burt Knack felt she will likely be able to return in 2 weeks if she is doing well at her follow-up appointment. He reduced her lipitor to 20mg  to try to improve side effect profile (LDL 86). She will need recheck LFTs/lipids in 6-8 weeks arranged at follow-up appt. Please also f/u H Pylori test that is still  pending at this time.   I have sent a message to our [facility] office's scheduler requesting a follow-up appointment, and our office will call the patient with this information. She was instructed to f/u OB GYN this week as planned, and f/u PCP to discuss further eval of nausea if it recurs. She was also referred to cardiac rehab.  Discharge Vitals: Blood pressure 137/80, pulse 84, temperature 98.6 F (37 C), temperature source Oral, resp. rate 18, height 5' (1.524 m), weight 222 lb 3.6 oz (100.8 kg), last menstrual period 09/04/2014, SpO2 100 %.  Labs: Lab Results  Component Value Date   WBC 7.1 09/14/2014   HGB 8.4* 09/14/2014   HCT 27.4* 09/14/2014   MCV 67.7* 09/14/2014   PLT 346 09/14/2014    Recent Labs Lab 09/12/14 0305  09/13/14 0312  NA 139  --  139  K 3.9  --  3.8  CL 107  --  106  CO2 24  --  23  BUN 6  --  5*  CREATININE 0.73  < > 0.69  CALCIUM 9.1  --  8.7*  PROT 7.4  --   --   BILITOT 0.4  --   --   ALKPHOS 60  --   --   ALT 21  --   --   AST 61*  --   --   GLUCOSE 106*  --  68  < > = values in this interval not displayed.  Lab Results  Component Value Date   CHOL 127 09/12/2014   HDL 29* 09/12/2014   LDLCALC 86 09/12/2014   TRIG 62 09/12/2014   No results found for: DDIMER  Diagnostic Studies/Procedures   Dg Chest 2 View  09/11/2014   CLINICAL DATA:  Centralized chest pain shortness of breath for 1 week. History of smoking.  EXAM: CHEST  2 VIEW  COMPARISON:  None.  FINDINGS: Borderline enlarged cardiac silhouette. Normal mediastinal contours. Minimal bilateral infrahilar heterogeneous opacities favored to represent atelectasis. No discrete focal airspace opacities. There is mild diffuse slightly nodular thickening of the pulmonary interstitium. There is minimal pleural parenchymal thickening about the right minor fissure. No pleural effusion or pneumothorax. No evidence of edema. No acute osseus abnormalities.  IMPRESSION: Findings suggestive of airways  disease / bronchitis. No focal airspace opacities to suggest pneumonia.   Electronically Signed   By: Sandi Mariscal M.D.   On: 09/11/2014 20:42   Dg Abd 1 View  09/15/2014   CLINICAL DATA:  Nausea and abdominal bloating  EXAM: ABDOMEN - 1 VIEW  COMPARISON:  None.  FINDINGS: The bowel gas pattern is normal. No radio-opaque calculi or other significant radiographic abnormality are seen.  IMPRESSION: Negative.   Electronically Signed   By: Conchita Paris M.D.   On: 09/15/2014 15:51   Ct Angio Chest Pe W/cm &/or Wo Cm  09/11/2014   CLINICAL DATA:  Initial UA shin for acute chest tightness, shortness of breath.  EXAM: CT ANGIOGRAPHY CHEST WITH CONTRAST  TECHNIQUE: Multidetector CT imaging of the chest was performed using the standard protocol during bolus administration of intravenous contrast. Multiplanar CT  image reconstructions and MIPs were obtained to evaluate the vascular anatomy.  CONTRAST:  192mL OMNIPAQUE IOHEXOL 350 MG/ML SOLN  COMPARISON:  Prior radiograph from earlier the same day  FINDINGS: Visualized thyroid gland is within normal limits. Left thyroid lobe is extends inferiorly towards the substernal region. No pathologically enlarged mediastinal, hilar, or axillary lymph nodes identified.  Intrathoracic aorta of normal caliber and appearance. Great vessels within normal limits.  Heart size normal.  No pericardial effusion.  Pulmonary arterial tree is adequately opacified for evaluation. No filling defect to suggest acute pulmonary embolism identified. Re-formatted imaging confirms these findings.  Lungs are clear without focal infiltrate or pulmonary edema. No pleural effusion. No pneumothorax. No worrisome pulmonary nodule or mass. Mild subsegmental atelectasis seen dependently within the lower lobes bilaterally.  Visualized portions of the upper abdomen are unremarkable.  No acute osseous abnormality. No worrisome lytic or blastic osseous lesions.  IMPRESSION: 1. No CT evidence for acute pulmonary  embolism. 2. No other acute cardiopulmonary process identified.   Electronically Signed   By: Jeannine Boga M.D.   On: 09/11/2014 22:37   Cardiac catheterization this admission, please see full report and above for summary.  2D Echo 09/12/14 - Left ventricle: The cavity size was mildly dilated. Wall thickness was normal. Systolic function was normal. The estimated ejection fraction was in the range of 50% to 55%. Wall motion was normal; there were no regional wall motion abnormalities. Left ventricular diastolic function parameters were normal. - Mitral valve: There was mild regurgitation.  Discharge Medications   Current Discharge Medication List    START taking these medications   Details  aspirin EC 81 MG EC tablet Take 1 tablet (81 mg total) by mouth daily. Qty: 30 tablet, Refills: 11    atorvastatin (LIPITOR) 20 MG tablet Take 1 tablet (20 mg total) by mouth every evening. Qty: 30 tablet, Refills: 6    clopidogrel (PLAVIX) 75 MG tablet Take 1 tablet (75 mg total) by mouth daily. Qty: 30 tablet, Refills: 11    metoprolol tartrate (LOPRESSOR) 25 MG tablet Take 0.5 tablets (12.5 mg total) by mouth 2 (two) times daily. Qty: 30 tablet, Refills: 6    nitroGLYCERIN (NITROSTAT) 0.4 MG SL tablet Place 1 tablet (0.4 mg total) under the tongue every 5 (five) minutes as needed for chest pain (up to 3 doses). Qty: 25 tablet, Refills: 3    pantoprazole (PROTONIX) 40 MG tablet Take 1 tablet (40 mg total) by mouth 2 (two) times daily before a meal. Qty: 60 tablet, Refills: 1        Disposition   The patient will be discharged in stable condition to home. Discharge Instructions    Amb Referral to Cardiac Rehabilitation    Complete by:  As directed   Congestive Heart Failure: If diagnosis is Heart Failure, patient MUST meet each of the CMS criteria: 1. Left Ventricular Ejection Fraction </= 35% 2. NYHA class II-IV symptoms despite being on optimal heart failure therapy  for at least 6 weeks. 3. Stable = have not had a recent (<6 weeks) or planned (<6 months) major cardiovascular hospitalization or procedure  Program Details: - Physician supervised classes - 1-3 classes per week over a 12-18 week period, generally for a total of 36 sessions  Physician Certification: I certify that the above Cardiac Rehabilitation treatment is medically necessary and is medically approved by me for treatment of this patient. The patient is willing and cooperative, able to ambulate and medically stable to participate in  exercise rehabilitation. The participant's progress and Individualized Treatment Plan will be reviewed by the Medical Director, Cardiac Rehab staff and as indicated by the Referring/Ordering Physician.  Diagnosis:  Myocardial Infarction     Diet - low sodium heart healthy    Complete by:  As directed      Increase activity slowly    Complete by:  As directed   It is extremely important that you take your medications. If you miss your heart medications, there is a chance that your stent could close and this could be life-threatening or even fatal.   No driving for 1 week. No lifting over 10 lbs for 2 weeks. No sexual activity for 2 weeks. You may not return to work until cleared by your cardiologist. We will see you in follow-up in about a week at which time we will decide if you are OK to return to work. Dr. Burt Knack anticipates you'll need about 2 weeks to recover. Keep procedure site clean & dry. If you notice increased pain, swelling, bleeding or pus, call/return!  You may shower, but no soaking baths/hot tubs/pools for 1 week.          Follow-up Information    Follow up with Sherren Mocha, MD.   Specialty:  Cardiology   Why:  Office will call you for your followup appointment. Call office if you have not heard back in 3 days.   Contact information:   [address] [address] [phone]       Follow up with OB-GYN.   Why:   It is very important to follow up with OB-GYN to discuss your menstrual bleeding and anemia - within the next week.      Follow up with Primary Care Doctor.   Why:  Please follow up with your primary care doctor for further evaluation of your nausea if it persists.        Duration of Discharge Encounter: Greater than 30 minutes including physician and PA time.  Rudean Hitt, Lenisha Lacap PA-C 09/16/2014, 9:35 AM

## 2014-09-16 NOTE — Progress Notes (Signed)
09/16/2014 2:14 AM Patient has been reconnected to her IV fluids.  Will continue to monitor patient. Terry Morgan

## 2014-09-16 NOTE — Progress Notes (Signed)
09/16/2014 12:02 AM After the patient was given her nightly medication, she stated that she did vomit a little bit not too long afterwards and says her medications are what's making her nauseous.  Also, the patient said that she wanted to be taken of the fluids for right now because it was getting in her way a lot and she does not believe she needs it right now but will probably put it on again later.  The patient has also been eating snacks and drinking fluids tonight without any problem.  Will continue to monitor the patient. Lupita Dawn, RN

## 2014-09-17 LAB — URINE CULTURE: Colony Count: 15000

## 2014-09-19 ENCOUNTER — Telehealth: Payer: Self-pay | Admitting: Cardiovascular Disease

## 2014-09-19 NOTE — Telephone Encounter (Signed)
New message     tcm appt with Terry Morgan on 6/7   Please schedule this patient for a follow-up appointment.   Primary Cardiologist: Burt Knack  Date of Discharge: 09/16/2014  Appointment Needed Within: 1 week  Appointment Type: TOC for NSTEMI   Thank you!  Dayna Dunn PA-C

## 2014-09-19 NOTE — Telephone Encounter (Signed)
Left message to call back  

## 2014-09-20 LAB — H. PYLORI ANTIBODY, IGG: H PYLORI IGG: 3.3 U/mL — AB (ref 0.0–0.8)

## 2014-09-20 NOTE — Telephone Encounter (Signed)
Left message to call back  

## 2014-09-23 NOTE — Telephone Encounter (Signed)
Unable to leave message VM full. 

## 2014-09-24 ENCOUNTER — Ambulatory Visit (INDEPENDENT_AMBULATORY_CARE_PROVIDER_SITE_OTHER): Payer: Medicaid Other | Admitting: Physician Assistant

## 2014-09-24 ENCOUNTER — Encounter: Payer: Self-pay | Admitting: *Deleted

## 2014-09-24 ENCOUNTER — Other Ambulatory Visit: Payer: Self-pay | Admitting: *Deleted

## 2014-09-24 ENCOUNTER — Encounter: Payer: Self-pay | Admitting: Physician Assistant

## 2014-09-24 VITALS — BP 120/68 | HR 69 | Ht 61.0 in | Wt 229.8 lb

## 2014-09-24 DIAGNOSIS — Z720 Tobacco use: Secondary | ICD-10-CM

## 2014-09-24 DIAGNOSIS — I214 Non-ST elevation (NSTEMI) myocardial infarction: Secondary | ICD-10-CM

## 2014-09-24 DIAGNOSIS — B9681 Helicobacter pylori [H. pylori] as the cause of diseases classified elsewhere: Secondary | ICD-10-CM

## 2014-09-24 DIAGNOSIS — K2980 Duodenitis without bleeding: Secondary | ICD-10-CM | POA: Diagnosis not present

## 2014-09-24 DIAGNOSIS — R112 Nausea with vomiting, unspecified: Secondary | ICD-10-CM

## 2014-09-24 DIAGNOSIS — A048 Other specified bacterial intestinal infections: Secondary | ICD-10-CM | POA: Insufficient documentation

## 2014-09-24 DIAGNOSIS — I2510 Atherosclerotic heart disease of native coronary artery without angina pectoris: Secondary | ICD-10-CM

## 2014-09-24 DIAGNOSIS — I25810 Atherosclerosis of coronary artery bypass graft(s) without angina pectoris: Secondary | ICD-10-CM

## 2014-09-24 DIAGNOSIS — D649 Anemia, unspecified: Secondary | ICD-10-CM

## 2014-09-24 LAB — BASIC METABOLIC PANEL
BUN: 10 mg/dL (ref 6–23)
CHLORIDE: 105 meq/L (ref 96–112)
CO2: 26 mEq/L (ref 19–32)
CREATININE: 0.71 mg/dL (ref 0.40–1.20)
Calcium: 9.3 mg/dL (ref 8.4–10.5)
GFR: 120.71 mL/min (ref >60.00)
Glucose, Bld: 108 mg/dL — ABNORMAL HIGH (ref 70–99)
Potassium: 3.9 mEq/L (ref 3.5–5.1)
Sodium: 138 mEq/L (ref 135–145)

## 2014-09-24 LAB — CBC
HCT: 30.4 % — ABNORMAL LOW (ref 36.0–46.0)
HEMOGLOBIN: 9.2 g/dL — AB (ref 12.0–15.0)
MCHC: 30.1 g/dL (ref 30.0–36.0)
MCV: 68.4 fl — ABNORMAL LOW (ref 78.0–100.0)
PLATELETS: 195 10*3/uL (ref 150.0–400.0)
RBC: 4.45 Mil/uL (ref 3.87–5.11)
RDW: 26.2 % — ABNORMAL HIGH (ref 11.5–15.5)
WBC: 5.6 10*3/uL (ref 4.0–10.5)

## 2014-09-24 NOTE — Assessment & Plan Note (Addendum)
Patient had an MI treated with thrombectomy and overlapping drug-eluting stents to the RCA. Recommend aspirin and Plavix for at least 12 months. Plavix was chosen over the more potent anti-platelet drugs  because of heavy menstrual bleeding and anemia. Return to work on light duty. Follow-up with Dr. Burt Knack in 2 months. Will need fasting lipid panel and LFTs in 6 weeks.

## 2014-09-24 NOTE — Assessment & Plan Note (Signed)
Patient needs to establish GYN. We will check a CBC today. Needs to be watched closely on Plavix and aspirin.

## 2014-09-24 NOTE — Telephone Encounter (Signed)
Patient had her appointment already.

## 2014-09-24 NOTE — Assessment & Plan Note (Signed)
Patient hasn't smoked since her MI. She is struggling with this. I discussed the importance of smoking cessation with her.

## 2014-09-24 NOTE — Assessment & Plan Note (Signed)
A she's positive for H pylori at 3.3. Refer to gastroenterologist and have asked her to call her primary care to begin treatment as soon as possible.

## 2014-09-24 NOTE — Assessment & Plan Note (Signed)
Patient is positive for H. pylori. Have referred to GI and recommend treatment by her primary care in the interim.

## 2014-09-24 NOTE — Progress Notes (Signed)
Cardiology Office Note   Date:  09/24/2014   ID:  Terry Morgan, DOB [birth year], MRN [id number]  PCP: Pennie Rushing Cardiologist: Dr. Burt Knack  Chief Complaint: Status post hospital follow-up    History of Present Illness: Terry Morgan is a 34 y.o. female who presents for post hospital follow-up. She was admitted with non-STEMI due to occlusion of the RCA treated with aspiration thrombectomy and overlapping ring drug-eluting stents. She also has anemia with chronic menorrhagia and needs to see a gastroenterologist. Her hospitalization was also complicated by nausea and H. pylori came back elevated at 3.3 after she was discharged. Follow-up 2-D echo on 09/12/14 showed mildly dilated LV, EF 50-55%, mild MR.  Since she's been home she denies any chest pain, palpitations, dyspnea, dyspnea on exertion, dizziness or presyncope. She has not smoked since hospitalization but is struggling with this. She has not seen her primary care and does not have a GYN established. Her hemoglobin was 8.4 and 09/14/14. She is wanting to return to work next week at the [employer]. She does some heavy lifting up to 100 pounds.    Past Medical History  Diagnosis Date  . Blood transfusion without reported diagnosis   . CAD (coronary artery disease)   . NSTEMI (non-ST elevated myocardial infarction)     a. NSTEMI 08/2014 due to acute occlusion of the RCA s/p aspiration thrombectomy and overlapping DES - EF 50-55%.  . Nausea   . Tobacco abuse   . Obesity   . Menorrhagia   . Thrombocytosis 09/11/2014  . GERD (gastroesophageal reflux disease)     Past Surgical History  Procedure Laterality Date  . Cesarean section    . Cardiac catheterization N/A 09/12/2014    Procedure: Left Heart Cath and Coronary Angiography;  Surgeon: Sherren Mocha, MD;  Location: [place];  Service: Cardiovascular;  Laterality: N/A;  . Cardiac catheterization N/A 09/12/2014    Procedure: Coronary Stent Intervention;   Surgeon: Sherren Mocha, MD;  Location: [place] [facility];  Service: Cardiovascular;  Laterality: N/A;  aspiration thrombectomy     Current Outpatient Prescriptions  Medication Sig Dispense Refill  . aspirin EC 81 MG EC tablet Take 1 tablet (81 mg total) by mouth daily. 30 tablet 11  . atorvastatin (LIPITOR) 20 MG tablet Take 1 tablet (20 mg total) by mouth every evening. 30 tablet 6  . clopidogrel (PLAVIX) 75 MG tablet Take 1 tablet (75 mg total) by mouth daily. 30 tablet 11  . metoprolol tartrate (LOPRESSOR) 25 MG tablet Take 0.5 tablets (12.5 mg total) by mouth 2 (two) times daily. 30 tablet 6  . nitroGLYCERIN (NITROSTAT) 0.4 MG SL tablet Place 1 tablet (0.4 mg total) under the tongue every 5 (five) minutes as needed for chest pain (up to 3 doses). 25 tablet 3  . pantoprazole (PROTONIX) 40 MG tablet Take 1 tablet (40 mg total) by mouth 2 (two) times daily before a meal. 60 tablet 1   No current facility-administered medications for this visit.    Allergies:   Review of patient's allergies indicates no known allergies.    Social History:  The patient  reports that she has been smoking Cigarettes.  She has a 12 pack-year smoking history. She does not have any smokeless tobacco history on file. She reports that she does not drink alcohol or use illicit drugs.   Family History:  The patient's    family history includes Asthma in her brother and brother; Healthy in her brother, brother,  brother, brother, brother, sister, and sister; Heart disease in her paternal grandmother; Heart murmur in her cousin; Hyperlipidemia in her father; Hypertension in her mother; Stroke (age of onset: 41) in her sister.    ROS:  Please see the history of present illness.   Otherwise, review of systems are positive for none.   All other systems are reviewed and negative.    PHYSICAL EXAM: VS:  BP 120/68 mmHg  Pulse 69  Ht 5\' 1"  (1.549 m)  Wt 229 lb 12.8 oz (104.237 kg)  BMI 43.44 kg/m2  LMP 09/04/2014 ,  BMI Body mass index is 43.44 kg/(m^2). GEN: Obese, well developed, in no acute distress Neck: no JVD, HJR, carotid bruits, or masses Cardiac:  RRR; no murmurs,gallop, rubs, thrill or heave,  Respiratory:  clear to auscultation bilaterally, normal work of breathing GI: soft, nontender, nondistended, + BS MS: no deformity or atrophy Extremities: Right groin without hematoma or hemorrhage, otherwise lower extremities without cyanosis, clubbing, edema, good distal pulses bilaterally.  Skin: warm and dry, no rash Neuro:  Strength and sensation are intact    EKG:  EKG is ordered today. The ekg ordered today demonstrates normal sinus rhythm inferior Q waves T wave inversion inferior lateral, no acute change Recent Labs: 09/11/2014: B Natriuretic Peptide 16.5 09/12/2014: ALT 21; Magnesium 1.8; TSH 0.495 09/13/2014: BUN 5*; Creatinine 0.69; Potassium 3.8; Sodium 139 09/14/2014: Hemoglobin 8.4*; Platelets 346    Lipid Panel    Component Value Date/Time   CHOL 127 09/12/2014 0305   TRIG 62 09/12/2014 0305   HDL 29* 09/12/2014 0305   CHOLHDL 4.4 09/12/2014 0305   VLDL 12 09/12/2014 0305   LDLCALC 86 09/12/2014 0305      Wt Readings from Last 3 Encounters:  09/24/14 229 lb 12.8 oz (104.237 kg)  09/16/14 222 lb 3.6 oz (100.8 kg)      Other studies Reviewed: Additional studies/ records that were reviewed today include and review of the records demonstrates:  2D Echo 09/12/14 - Left ventricle: The cavity size was mildly dilated. Wall   thickness was normal. Systolic function was normal. The estimated   ejection fraction was in the range of 50% to 55%. Wall motion was   normal; there were no regional wall motion abnormalities. Left   ventricular diastolic function parameters were normal. - Mitral valve: There was mild regurgitation.   Cardiac catheterization   09/12/14  Conclusion     FINAL CONCLUSIONS:  TOTAL OCCLUSION OF THE RCA WITH EXTENSIVE THROMBUS  NORMAL LEFT MAIN, LAD, AND  LCX  SUCCESSFUL PCI OF THE RCA WITH ASPIRATION THROMBECTOMY AND OVERLAPPING DES  RECOMMENDATIONS:  DAPT WITH ASA AND PLAVIX AT LEAST 12 MONTHS  AGGRESSIVE RISK REDUCTION  PLAVIX CHOSEN OVER MORE POTENT ANTIPLATELET DRUGS BECAUSE OF ANEMIA/HEAVY MENSTRUAL BLEEDING        ASSESSMENT AND PLAN: CAD (coronary artery disease) Patient had an MI treated with thrombectomy and overlapping drug-eluting stents to the RCA. Recommend aspirin and Plavix for at least 12 months. Plavix was chosen over the more potent anti-platelet drugs  because of heavy menstrual bleeding and anemia. Return to work on light duty. Follow-up with Dr. Burt Knack in 2 months. Will need fasting lipid panel and LFTs in 6 weeks.   Nausea & vomiting A she's positive for H pylori at 3.3. Refer to gastroenterologist and have asked her to call her primary care to begin treatment as soon as possible.   Tobacco abuse Patient hasn't smoked since her MI. She is struggling with  this. I discussed the importance of smoking cessation with her.   H. pylori infection Patient is positive for H. pylori. Have referred to GI and recommend treatment by her primary care in the interim.   Anemia Patient needs to establish GYN. We will check a CBC today. Needs to be watched closely on Plavix and aspirin.      Sumner Boast, PA-C  09/24/2014 12:24 PM    [facility] [address], [place], [place]  [address] Phone: [phone]; Fax: [phone]

## 2014-09-24 NOTE — Patient Instructions (Addendum)
Medication Instructions:   Your physician recommends that you continue on your current medications as directed. Please refer to the Current Medication list given to you today.   Labwork:  BMET CBC   Testing/Procedures:   Follow-Up:   DR Burt Knack IN 3 MONTHS OR RECALL   GASTROENTEROLOGY REFERRAL FOR H PYLORI   Any Other Special Instructions Will Be Listed Below (If Applicable).  MAKE SURE YOU FOLLOW UP WITH YOUR PRIMARY CARE DOCTOR   MAKE SURE YOU FIND AN OBGYN FOR YOUR ANEMIA

## 2014-09-25 ENCOUNTER — Telehealth: Payer: Self-pay | Admitting: *Deleted

## 2014-09-25 NOTE — Telephone Encounter (Signed)
-----   Message from Imogene Burn, PA-C sent at 09/25/2014  7:44 AM EDT ----- Labs stable. Forward to primary MD

## 2014-10-09 ENCOUNTER — Telehealth: Payer: Self-pay | Admitting: Cardiovascular Disease

## 2014-10-09 NOTE — Telephone Encounter (Signed)
Per OV with April Manson pt was referred to GI. Per GI on -6/7 referral must come from patients PCP for [facility].  Informed patient that she would need to contact PCP for referral.

## 2014-10-10 ENCOUNTER — Ambulatory Visit (HOSPITAL_COMMUNITY): Payer: Medicaid Other

## 2014-10-14 ENCOUNTER — Ambulatory Visit (HOSPITAL_COMMUNITY): Payer: Medicaid Other

## 2014-10-16 ENCOUNTER — Ambulatory Visit (HOSPITAL_COMMUNITY): Payer: Medicaid Other

## 2014-10-17 ENCOUNTER — Ambulatory Visit (HOSPITAL_COMMUNITY): Payer: Medicaid Other

## 2014-10-18 ENCOUNTER — Ambulatory Visit (HOSPITAL_COMMUNITY): Payer: Medicaid Other

## 2014-10-23 ENCOUNTER — Ambulatory Visit (HOSPITAL_COMMUNITY): Payer: Medicaid Other

## 2014-10-25 ENCOUNTER — Ambulatory Visit (HOSPITAL_COMMUNITY): Payer: Medicaid Other

## 2014-10-28 ENCOUNTER — Ambulatory Visit (HOSPITAL_COMMUNITY): Payer: Medicaid Other

## 2014-10-30 ENCOUNTER — Ambulatory Visit (HOSPITAL_COMMUNITY): Payer: Medicaid Other

## 2014-11-01 ENCOUNTER — Ambulatory Visit (HOSPITAL_COMMUNITY): Payer: Medicaid Other

## 2014-11-04 ENCOUNTER — Ambulatory Visit (HOSPITAL_COMMUNITY): Payer: Medicaid Other

## 2014-11-05 ENCOUNTER — Other Ambulatory Visit: Payer: Medicaid Other

## 2014-11-06 ENCOUNTER — Ambulatory Visit (HOSPITAL_COMMUNITY): Payer: Medicaid Other

## 2014-11-08 ENCOUNTER — Ambulatory Visit (HOSPITAL_COMMUNITY): Payer: Medicaid Other

## 2014-11-11 ENCOUNTER — Ambulatory Visit (HOSPITAL_COMMUNITY): Payer: Medicaid Other

## 2014-11-13 ENCOUNTER — Ambulatory Visit (HOSPITAL_COMMUNITY): Payer: Medicaid Other

## 2014-11-15 ENCOUNTER — Ambulatory Visit (HOSPITAL_COMMUNITY): Payer: Medicaid Other

## 2014-11-18 ENCOUNTER — Inpatient Hospital Stay (HOSPITAL_COMMUNITY)
Admission: AD | Admit: 2014-11-18 | Discharge: 2014-11-19 | Disposition: A | Discharge status: Home / Self Care | Payer: Medicaid Other | Source: Ambulatory Visit | Attending: Family Medicine | Admitting: Family Medicine

## 2014-11-18 ENCOUNTER — Ambulatory Visit (HOSPITAL_COMMUNITY): Payer: Medicaid Other

## 2014-11-18 DIAGNOSIS — O99331 Smoking (tobacco) complicating pregnancy, first trimester: Secondary | ICD-10-CM | POA: Diagnosis not present

## 2014-11-18 DIAGNOSIS — O26891 Other specified pregnancy related conditions, first trimester: Secondary | ICD-10-CM

## 2014-11-18 DIAGNOSIS — O9989 Other specified diseases and conditions complicating pregnancy, childbirth and the puerperium: Secondary | ICD-10-CM | POA: Diagnosis not present

## 2014-11-18 DIAGNOSIS — R102 Pelvic and perineal pain: Secondary | ICD-10-CM | POA: Diagnosis not present

## 2014-11-18 DIAGNOSIS — Z3A09 9 weeks gestation of pregnancy: Secondary | ICD-10-CM | POA: Insufficient documentation

## 2014-11-18 DIAGNOSIS — R109 Unspecified abdominal pain: Secondary | ICD-10-CM | POA: Diagnosis present

## 2014-11-18 DIAGNOSIS — F17210 Nicotine dependence, cigarettes, uncomplicated: Secondary | ICD-10-CM | POA: Diagnosis not present

## 2014-11-18 DIAGNOSIS — Z3A01 Less than 8 weeks gestation of pregnancy: Secondary | ICD-10-CM | POA: Diagnosis not present

## 2014-11-18 LAB — POCT PREGNANCY, URINE: PREG TEST UR: POSITIVE — AB

## 2014-11-18 NOTE — MAU Note (Signed)
Pt lifted something heavy at work 30 min ago and upon lifting it, she felt something sharp pull in center of her lower abdomen.  Pt discovered  Pink vag mucous minutes after the pain began.

## 2014-11-18 NOTE — MAU Note (Signed)
Pt c/o sharp lower abdominal pain and vaginal spotting that began 30 mins ago. LMP: May ?Marland Kitchen Pt did not take anything for pain.

## 2014-11-19 ENCOUNTER — Encounter (HOSPITAL_COMMUNITY): Payer: Self-pay | Admitting: *Deleted

## 2014-11-19 ENCOUNTER — Inpatient Hospital Stay (HOSPITAL_COMMUNITY): Payer: Medicaid Other

## 2014-11-19 DIAGNOSIS — R109 Unspecified abdominal pain: Secondary | ICD-10-CM

## 2014-11-19 DIAGNOSIS — O26891 Other specified pregnancy related conditions, first trimester: Secondary | ICD-10-CM

## 2014-11-19 DIAGNOSIS — Z3A01 Less than 8 weeks gestation of pregnancy: Secondary | ICD-10-CM

## 2014-11-19 LAB — CBC
HCT: 26.6 % — ABNORMAL LOW (ref 36.0–46.0)
Hemoglobin: 8.2 g/dL — ABNORMAL LOW (ref 12.0–15.0)
MCH: 21.2 pg — AB (ref 26.0–34.0)
MCHC: 30.8 g/dL (ref 30.0–36.0)
MCV: 68.7 fL — ABNORMAL LOW (ref 78.0–100.0)
Platelets: 188 10*3/uL (ref 150–400)
RBC: 3.87 MIL/uL (ref 3.87–5.11)
RDW: 22.6 % — ABNORMAL HIGH (ref 11.5–15.5)
WBC: 7.9 10*3/uL (ref 4.0–10.5)

## 2014-11-19 LAB — WET PREP, GENITAL
Trich, Wet Prep: NONE SEEN
YEAST WET PREP: NONE SEEN

## 2014-11-19 LAB — HIV ANTIBODY (ROUTINE TESTING W REFLEX): HIV SCREEN 4TH GENERATION: NONREACTIVE

## 2014-11-19 LAB — URINALYSIS, ROUTINE W REFLEX MICROSCOPIC
Bilirubin Urine: NEGATIVE
Glucose, UA: NEGATIVE mg/dL
Hgb urine dipstick: NEGATIVE
Ketones, ur: NEGATIVE mg/dL
Leukocytes, UA: NEGATIVE
Nitrite: NEGATIVE
PROTEIN: NEGATIVE mg/dL
Specific Gravity, Urine: 1.02 (ref 1.005–1.030)
Urobilinogen, UA: 0.2 mg/dL (ref 0.0–1.0)
pH: 5.5 (ref 5.0–8.0)

## 2014-11-19 LAB — ABO/RH: ABO/RH(D): O POS

## 2014-11-19 LAB — HCG, QUANTITATIVE, PREGNANCY: hCG, Beta Chain, Quant, S: 99583 m[IU]/mL — ABNORMAL HIGH (ref <5)

## 2014-11-19 NOTE — MAU Provider Note (Signed)
History     CSN: [id number]  Arrival date and time: 11/18/14 2336   First Provider Initiated Contact with Patient 11/19/14 0006      No chief complaint on file.  Abdominal Pain This is a new problem. The current episode started today (at 2330). The onset quality is gradual. The problem has been unchanged. The pain is located in the suprapubic region. The pain is at a severity of 6/10. The quality of the pain is cramping. The abdominal pain does not radiate. Pertinent negatives include no constipation, diarrhea, dysuria, fever, frequency, nausea or vomiting. Associated symptoms comments: Vaginal spotting. No clots, no tissue. Lighter than menses. . Nothing aggravates the pain. The pain is relieved by nothing. She has tried nothing for the symptoms.    Past Medical History  Diagnosis Date  . Blood transfusion without reported diagnosis   . CAD (coronary artery disease)   . NSTEMI (non-ST elevated myocardial infarction)     a. NSTEMI 08/2014 due to acute occlusion of the RCA s/p aspiration thrombectomy and overlapping DES - EF 50-55%.  . Nausea   . Tobacco abuse   . Obesity   . Menorrhagia   . Thrombocytosis 09/11/2014  . GERD (gastroesophageal reflux disease)     Past Surgical History  Procedure Laterality Date  . Cesarean section    . Cardiac catheterization N/A 09/12/2014    Procedure: Left Heart Cath and Coronary Angiography;  Surgeon: Sherren Mocha, MD;  Location: [facility];  Service: Cardiovascular;  Laterality: N/A;  . Cardiac catheterization N/A 09/12/2014    Procedure: Coronary Stent Intervention;  Surgeon: Sherren Mocha, MD;  Location: [facility];  Service: Cardiovascular;  Laterality: N/A;  aspiration thrombectomy    Family History  Problem Relation Age of Onset  . Hypertension Mother   . Hyperlipidemia Father   . Stroke Sister 46  . Healthy Brother   . Healthy Sister   . Healthy Sister   . Healthy Brother   . Healthy Brother   . Healthy Brother    . Healthy Brother   . Asthma Brother   . Asthma Brother   . Heart disease Paternal Grandmother   . Heart murmur Cousin     History  Substance Use Topics  . Smoking status: Current Every Day Smoker -- 1.00 packs/day for 12 years    Types: Cigarettes  . Smokeless tobacco: Not on file  . Alcohol Use: No    Allergies: No Known Allergies  Prescriptions prior to admission  Medication Sig Dispense Refill Last Dose  . aspirin EC 81 MG EC tablet Take 1 tablet (81 mg total) by mouth daily. 30 tablet 11 Past Week at Unknown time  . atorvastatin (LIPITOR) 20 MG tablet Take 1 tablet (20 mg total) by mouth every evening. 30 tablet 6  at Unknown time  . clopidogrel (PLAVIX) 75 MG tablet Take 1 tablet (75 mg total) by mouth daily. 30 tablet 11 Past Week at Unknown time  . metoprolol tartrate (LOPRESSOR) 25 MG tablet Take 0.5 tablets (12.5 mg total) by mouth 2 (two) times daily. 30 tablet 6 Taking  . nitroGLYCERIN (NITROSTAT) 0.4 MG SL tablet Place 1 tablet (0.4 mg total) under the tongue every 5 (five) minutes as needed for chest pain (up to 3 doses). 25 tablet 3 More than a month at Unknown time  . pantoprazole (PROTONIX) 40 MG tablet Take 1 tablet (40 mg total) by mouth 2 (two) times daily before a meal. 60 tablet 1 Taking  Review of Systems  Constitutional: Negative for fever.  Gastrointestinal: Positive for abdominal pain. Negative for nausea, vomiting, diarrhea and constipation.  Genitourinary: Negative for dysuria, urgency and frequency.   Physical Exam   Blood pressure 117/70, pulse 74, temperature 98.8 F (37.1 C), temperature source Oral, resp. rate 18, height 5' 1.5" (1.562 m), weight 108.5 kg (239 lb 3.2 oz), last menstrual period 09/13/2014, SpO2 100 %.  Physical Exam  Nursing note and vitals reviewed. Constitutional: She is oriented to person, place, and time. She appears well-developed and well-nourished. No distress.  HENT:  Head: Normocephalic.  Cardiovascular: Normal  rate.   Respiratory: Effort normal.  GI: Soft. There is no tenderness. There is no rebound.  Genitourinary:   External: no lesion Vagina: small amount of pink blood seen  Cervix: pink, smooth, no CMT Uterus: NSSC Adnexa: NT   Neurological: She is alert and oriented to person, place, and time.  Skin: Skin is warm and dry.  Psychiatric: She has a normal mood and affect.   Results for orders placed or performed during the hospital encounter of 11/18/14 (from the past 24 hour(s))  Urinalysis, Routine w reflex microscopic (not at [facility])     Status: None   Collection Time: 11/18/14 11:46 PM  Result Value Ref Range   Color, Urine YELLOW YELLOW   APPearance CLEAR CLEAR   Specific Gravity, Urine 1.020 1.005 - 1.030   pH 5.5 5.0 - 8.0   Glucose, UA NEGATIVE NEGATIVE mg/dL   Hgb urine dipstick NEGATIVE NEGATIVE   Bilirubin Urine NEGATIVE NEGATIVE   Ketones, ur NEGATIVE NEGATIVE mg/dL   Protein, ur NEGATIVE NEGATIVE mg/dL   Urobilinogen, UA 0.2 0.0 - 1.0 mg/dL   Nitrite NEGATIVE NEGATIVE   Leukocytes, UA NEGATIVE NEGATIVE  Pregnancy, urine POC     Status: Abnormal   Collection Time: 11/18/14 11:56 PM  Result Value Ref Range   Preg Test, Ur POSITIVE (A) NEGATIVE  Wet prep, genital     Status: Abnormal   Collection Time: 11/19/14 12:05 AM  Result Value Ref Range   Yeast Wet Prep HPF POC NONE SEEN NONE SEEN   Trich, Wet Prep NONE SEEN NONE SEEN   Clue Cells Wet Prep HPF POC FEW (A) NONE SEEN   WBC, Wet Prep HPF POC FEW (A) NONE SEEN  CBC     Status: Abnormal   Collection Time: 11/19/14 12:14 AM  Result Value Ref Range   WBC 7.9 4.0 - 10.5 K/uL   RBC 3.87 3.87 - 5.11 MIL/uL   Hemoglobin 8.2 (L) 12.0 - 15.0 g/dL   HCT 26.6 (L) 36.0 - 46.0 %   MCV 68.7 (L) 78.0 - 100.0 fL   MCH 21.2 (L) 26.0 - 34.0 pg   MCHC 30.8 30.0 - 36.0 g/dL   RDW 22.6 (H) 11.5 - 15.5 %   Platelets 188 150 - 400 K/uL   US Ob Comp Less 14 Wks  11/19/2014   CLINICAL DATA:  Acute onset of pelvic pain and vaginal  spotting. Initial encounter.  EXAM: OBSTETRIC <14 WK [place] AND TRANSVAGINAL OB US  TECHNIQUE: Both transabdominal and transvaginal ultrasound examinations were performed for complete evaluation of the gestation as well as the maternal uterus, adnexal regions, and pelvic cul-de-sac. Transvaginal technique was performed to assess early pregnancy.  COMPARISON:  Pelvic ultrasound performed 08/23/2005  FINDINGS: Intrauterine gestational sac: Visualized/normal in shape.  Yolk sac:  Yes  Embryo:  Yes  Cardiac Activity: Yes  Heart Rate: 162  bpm  CRL:  27.9 mm   9 w   5 d                  [place] EDC: 06/19/2015  Maternal uterus/adnexae: A small amount of subchorionic hemorrhage is noted. The uterus is otherwise unremarkable.  The ovaries are within normal limits. The right ovary measures 4.2 x 3.6 x 2.1 cm, while the left ovary measures 4.8 x 2.1 x 1.8 cm. No suspicious adnexal masses are seen; there is no evidence for ovarian torsion.  No free fluid is seen within the pelvic cul-de-sac.  IMPRESSION: Single live intrauterine pregnancy noted, with a crown-rump length of 2.8 cm, corresponding to a gestational age of [redacted] weeks 5 days. This matches the gestational age of [redacted] weeks 4 days by LMP, reflecting an estimated date of delivery of June 20, 2015.   Electronically Signed   By: Garald Balding M.D.   On: 11/19/2014 00:41   US Ob Transvaginal  11/19/2014   CLINICAL DATA:  Acute onset of pelvic pain and vaginal spotting. Initial encounter.  EXAM: OBSTETRIC <14 WK [place] AND TRANSVAGINAL OB US  TECHNIQUE: Both transabdominal and transvaginal ultrasound examinations were performed for complete evaluation of the gestation as well as the maternal uterus, adnexal regions, and pelvic cul-de-sac. Transvaginal technique was performed to assess early pregnancy.  COMPARISON:  Pelvic ultrasound performed 08/23/2005  FINDINGS: Intrauterine gestational sac: Visualized/normal in shape.  Yolk sac:  Yes  Embryo:  Yes  Cardiac Activity: Yes  Heart Rate:  162  bpm  CRL:  27.9 mm   9 w   5 d                  [place] EDC: 06/19/2015  Maternal uterus/adnexae: A small amount of subchorionic hemorrhage is noted. The uterus is otherwise unremarkable.  The ovaries are within normal limits. The right ovary measures 4.2 x 3.6 x 2.1 cm, while the left ovary measures 4.8 x 2.1 x 1.8 cm. No suspicious adnexal masses are seen; there is no evidence for ovarian torsion.  No free fluid is seen within the pelvic cul-de-sac.  IMPRESSION: Single live intrauterine pregnancy noted, with a crown-rump length of 2.8 cm, corresponding to a gestational age of [redacted] weeks 5 days. This matches the gestational age of [redacted] weeks 4 days by LMP, reflecting an estimated date of delivery of June 20, 2015.   Electronically Signed   By: Garald Balding M.D.   On: 11/19/2014 00:41     MAU Course  Procedures  MDM   Assessment and Plan   1. Pelvic pain affecting pregnancy in first trimester, antepartum    DC home Comfort measures reviewed  1st Trimester precautions  Bleeding precautions RX: none  Return to MAU as needed FU with OB as planned  Follow-up Information    Follow up with [facility].   Specialty:  Obstetrics and Gynecology   Why:  They will call you with an appointment    Contact information:   [place] [phone]        Mathis Bud 11/19/2014, 12:11 AM

## 2014-11-19 NOTE — Discharge Instructions (Signed)
First Trimester of Pregnancy The first trimester of pregnancy is from week 1 until the end of week 12 (months 1 through 3). A week after a sperm fertilizes an egg, the egg will implant on the wall of the uterus. This embryo will begin to develop into a baby. Genes from you and your partner are forming the baby. The female genes determine whether the baby is a boy or a girl. At 6-8 weeks, the eyes and face are formed, and the heartbeat can be seen on ultrasound. At the end of 12 weeks, all the baby's organs are formed.  Now that you are pregnant, you will want to do everything you can to have a healthy baby. Two of the most important things are to get good prenatal care and to follow your health care provider's instructions. Prenatal care is all the medical care you receive before the baby's birth. This care will help prevent, find, and treat any problems during the pregnancy and childbirth. BODY CHANGES Your body goes through many changes during pregnancy. The changes vary from woman to woman.   You may gain or lose a couple of pounds at first.  You may feel sick to your stomach (nauseous) and throw up (vomit). If the vomiting is uncontrollable, call your health care provider.  You may tire easily.  You may develop headaches that can be relieved by medicines approved by your health care provider.  You may urinate more often. Painful urination may mean you have a bladder infection.  You may develop heartburn as a result of your pregnancy.  You may develop constipation because certain hormones are causing the muscles that push waste through your intestines to slow down.  You may develop hemorrhoids or swollen, bulging veins (varicose veins).  Your breasts may begin to grow larger and become tender. Your nipples may stick out more, and the tissue that surrounds them (areola) may become darker.  Your gums may bleed and may be sensitive to brushing and flossing.  Dark spots or blotches (chloasma,  mask of pregnancy) may develop on your face. This will likely fade after the baby is born.  Your menstrual periods will stop.  You may have a loss of appetite.  You may develop cravings for certain kinds of food.  You may have changes in your emotions from day to day, such as being excited to be pregnant or being concerned that something may go wrong with the pregnancy and baby.  You may have more vivid and strange dreams.  You may have changes in your hair. These can include thickening of your hair, rapid growth, and changes in texture. Some women also have hair loss during or after pregnancy, or hair that feels dry or thin. Your hair will most likely return to normal after your baby is born. WHAT TO EXPECT AT YOUR PRENATAL VISITS During a routine prenatal visit:  You will be weighed to make sure you and the baby are growing normally.  Your blood pressure will be taken.  Your abdomen will be measured to track your baby's growth.  The fetal heartbeat will be listened to starting around week 10 or 12 of your pregnancy.  Test results from any previous visits will be discussed. Your health care provider may ask you:  How you are feeling.  If you are feeling the baby move.  If you have had any abnormal symptoms, such as leaking fluid, bleeding, severe headaches, or abdominal cramping.  If you have any questions. Other tests   that may be performed during your first trimester include:  Blood tests to find your blood type and to check for the presence of any previous infections. They will also be used to check for low iron levels (anemia) and Rh antibodies. Later in the pregnancy, blood tests for diabetes will be done along with other tests if problems develop.  Urine tests to check for infections, diabetes, or protein in the urine.  An ultrasound to confirm the proper growth and development of the baby.  An amniocentesis to check for possible genetic problems.  Fetal screens for  spina bifida and Down syndrome.  You may need other tests to make sure you and the baby are doing well. HOME CARE INSTRUCTIONS  Medicines  Follow your health care provider's instructions regarding medicine use. Specific medicines may be either safe or unsafe to take during pregnancy.  Take your prenatal vitamins as directed.  If you develop constipation, try taking a stool softener if your health care provider approves. Diet  Eat regular, well-balanced meals. Choose a variety of foods, such as meat or vegetable-based protein, fish, milk and low-fat dairy products, vegetables, fruits, and whole grain breads and cereals. Your health care provider will help you determine the amount of weight gain that is right for you.  Avoid raw meat and uncooked cheese. These carry germs that can cause birth defects in the baby.  Eating four or five small meals rather than three large meals a day may help relieve nausea and vomiting. If you start to feel nauseous, eating a few soda crackers can be helpful. Drinking liquids between meals instead of during meals also seems to help nausea and vomiting.  If you develop constipation, eat more high-fiber foods, such as fresh vegetables or fruit and whole grains. Drink enough fluids to keep your urine clear or pale yellow. Activity and Exercise  Exercise only as directed by your health care provider. Exercising will help you:  Control your weight.  Stay in shape.  Be prepared for labor and delivery.  Experiencing pain or cramping in the lower abdomen or low back is a good sign that you should stop exercising. Check with your health care provider before continuing normal exercises.  Try to avoid standing for long periods of time. Move your legs often if you must stand in one place for a long time.  Avoid heavy lifting.  Wear low-heeled shoes, and practice good posture.  You may continue to have sex unless your health care provider directs you  otherwise. Relief of Pain or Discomfort  Wear a good support bra for breast tenderness.   Take warm sitz baths to soothe any pain or discomfort caused by hemorrhoids. Use hemorrhoid cream if your health care provider approves.   Rest with your legs elevated if you have leg cramps or low back pain.  If you develop varicose veins in your legs, wear support hose. Elevate your feet for 15 minutes, 3-4 times a day. Limit salt in your diet. Prenatal Care  Schedule your prenatal visits by the twelfth week of pregnancy. They are usually scheduled monthly at first, then more often in the last 2 months before delivery.  Write down your questions. Take them to your prenatal visits.  Keep all your prenatal visits as directed by your health care provider. Safety  Wear your seat belt at all times when driving.  Make a list of emergency phone numbers, including numbers for family, friends, the hospital, and police and fire departments. General Tips    Ask your health care provider for a referral to a local prenatal education class. Begin classes no later than at the beginning of month 6 of your pregnancy.  Ask for help if you have counseling or nutritional needs during pregnancy. Your health care provider can offer advice or refer you to specialists for help with various needs.  Do not use hot tubs, steam rooms, or saunas.  Do not douche or use tampons or scented sanitary pads.  Do not cross your legs for long periods of time.  Avoid cat litter boxes and soil used by cats. These carry germs that can cause birth defects in the baby and possibly loss of the fetus by miscarriage or stillbirth.  Avoid all smoking, herbs, alcohol, and medicines not prescribed by your health care provider. Chemicals in these affect the formation and growth of the baby.  Schedule a dentist appointment. At home, brush your teeth with a soft toothbrush and be gentle when you floss. SEEK MEDICAL CARE IF:   You have  dizziness.  You have mild pelvic cramps, pelvic pressure, or nagging pain in the abdominal area.  You have persistent nausea, vomiting, or diarrhea.  You have a bad smelling vaginal discharge.  You have pain with urination.  You notice increased swelling in your face, hands, legs, or ankles. SEEK IMMEDIATE MEDICAL CARE IF:   You have a fever.  You are leaking fluid from your vagina.  You have spotting or bleeding from your vagina.  You have severe abdominal cramping or pain.  You have rapid weight gain or loss.  You vomit blood or material that looks like coffee grounds.  You are exposed to German measles and have never had them.  You are exposed to fifth disease or chickenpox.  You develop a severe headache.  You have shortness of breath.  You have any kind of trauma, such as from a fall or a car accident. Document Released: 03/30/2001 Document Revised: 08/20/2013 Document Reviewed: 02/13/2013 [employer] Patient Information 2015 [employer]. This information is not intended to replace advice given to you by your health care provider. Make sure you discuss any questions you have with your health care provider.  

## 2014-11-20 ENCOUNTER — Ambulatory Visit (HOSPITAL_COMMUNITY): Payer: Medicaid Other

## 2014-11-20 LAB — GC/CHLAMYDIA PROBE AMP (~~LOC~~) NOT AT ARMC
CHLAMYDIA, DNA PROBE: NEGATIVE
Neisseria Gonorrhea: NEGATIVE

## 2014-11-22 ENCOUNTER — Ambulatory Visit (HOSPITAL_COMMUNITY): Payer: Medicaid Other

## 2014-11-25 ENCOUNTER — Ambulatory Visit (HOSPITAL_COMMUNITY): Payer: Medicaid Other

## 2014-11-27 ENCOUNTER — Ambulatory Visit (HOSPITAL_COMMUNITY): Payer: Medicaid Other

## 2014-11-29 ENCOUNTER — Ambulatory Visit (HOSPITAL_COMMUNITY): Payer: Medicaid Other

## 2014-12-02 ENCOUNTER — Ambulatory Visit (HOSPITAL_COMMUNITY): Payer: Medicaid Other

## 2014-12-04 ENCOUNTER — Ambulatory Visit (HOSPITAL_COMMUNITY): Payer: Medicaid Other

## 2014-12-05 ENCOUNTER — Encounter: Payer: Medicaid Other | Admitting: Family Medicine

## 2014-12-05 ENCOUNTER — Encounter: Payer: Self-pay | Admitting: Family Medicine

## 2014-12-06 ENCOUNTER — Ambulatory Visit (HOSPITAL_COMMUNITY): Payer: Medicaid Other

## 2014-12-09 ENCOUNTER — Ambulatory Visit (HOSPITAL_COMMUNITY): Payer: Medicaid Other

## 2014-12-11 ENCOUNTER — Ambulatory Visit (HOSPITAL_COMMUNITY): Payer: Medicaid Other

## 2014-12-13 ENCOUNTER — Ambulatory Visit (HOSPITAL_COMMUNITY): Payer: Medicaid Other

## 2014-12-16 ENCOUNTER — Ambulatory Visit (HOSPITAL_COMMUNITY): Payer: Medicaid Other

## 2014-12-18 ENCOUNTER — Ambulatory Visit (HOSPITAL_COMMUNITY): Payer: Medicaid Other

## 2014-12-20 ENCOUNTER — Ambulatory Visit (HOSPITAL_COMMUNITY): Payer: Medicaid Other

## 2014-12-25 ENCOUNTER — Ambulatory Visit (HOSPITAL_COMMUNITY): Payer: Medicaid Other

## 2014-12-27 ENCOUNTER — Ambulatory Visit (HOSPITAL_COMMUNITY): Payer: Medicaid Other

## 2014-12-30 ENCOUNTER — Ambulatory Visit (HOSPITAL_COMMUNITY): Payer: Medicaid Other

## 2015-01-01 ENCOUNTER — Ambulatory Visit (HOSPITAL_COMMUNITY): Payer: Medicaid Other

## 2015-01-03 ENCOUNTER — Ambulatory Visit (HOSPITAL_COMMUNITY): Payer: Medicaid Other

## 2015-01-06 ENCOUNTER — Ambulatory Visit (HOSPITAL_COMMUNITY): Payer: Medicaid Other

## 2015-01-08 ENCOUNTER — Ambulatory Visit (HOSPITAL_COMMUNITY): Payer: Medicaid Other

## 2015-01-10 ENCOUNTER — Ambulatory Visit (HOSPITAL_COMMUNITY): Payer: Medicaid Other

## 2015-02-12 ENCOUNTER — Ambulatory Visit (INDEPENDENT_AMBULATORY_CARE_PROVIDER_SITE_OTHER): Payer: Medicaid Other | Admitting: Cardiovascular Disease

## 2015-02-12 ENCOUNTER — Encounter: Payer: Self-pay | Admitting: Cardiovascular Disease

## 2015-02-12 VITALS — BP 120/78 | HR 72 | Ht 61.0 in | Wt 233.0 lb

## 2015-02-12 DIAGNOSIS — I25119 Atherosclerotic heart disease of native coronary artery with unspecified angina pectoris: Secondary | ICD-10-CM | POA: Diagnosis not present

## 2015-02-12 MED ORDER — METOPROLOL TARTRATE 25 MG PO TABS: 12.5000 mg | ORAL_TABLET | Freq: Two times a day (BID) | ORAL | Status: DC

## 2015-02-12 NOTE — Progress Notes (Signed)
Cardiology Office Note Date:  02/14/2015   ID:  [name], DOB 1980-10-21, MRN [id number]  PCP:  Terry Plum, MD  Cardiologist:  Tonny Bollman, MD    Chief Complaint  Patient presents with  . Chest Pain    History of Present Illness: Terry Morgan is a 34 y.o. female who presents for follow-up evaluation. The patient is followed for coronary artery disease. She was hospitalized in May 2016 with a non-ST elevation infarction due to total occlusion of the RCA. She had prolonged chest pain with ongoing ischemic symptoms and was brought emergently to the cardiac catheterization lab for aspiration thrombectomy and overlapping drug-eluting stent placement in the right coronary artery. Her last echocardiogram 09/12/2014, performed after her PCI procedure, showed an LVEF of 50-55% with mild mitral regurgitation.  The patient works at the [employer]. With heavy lifting, she develops a sharp pain in the left chest. It has some characteristics of the pain that occurred with her heart attack. It is not nearly as severe. She denies shortness of breath, orthopnea, PND, or heart palpitations. She smokes cigarettes on occasion.  Past Medical History  Diagnosis Date  . Blood transfusion without reported diagnosis   . CAD (coronary artery disease)   . NSTEMI (non-ST elevated myocardial infarction) (HCC)     a. NSTEMI 08/2014 due to acute occlusion of the RCA s/p aspiration thrombectomy and overlapping DES - EF 50-55%.  . Nausea   . Tobacco abuse   . Obesity   . Menorrhagia   . Thrombocytosis (HCC) 09/11/2014  . GERD (gastroesophageal reflux disease)     Past Surgical History  Procedure Laterality Date  . Cesarean section    . Cardiac catheterization N/A 09/12/2014    Procedure: Left Heart Cath and Coronary Angiography;  Surgeon: Tonny Bollman, MD;  Location: [facility] INVASIVE CV LAB;  Service: Cardiovascular;  Laterality: N/A;  . Cardiac catheterization N/A 09/12/2014    Procedure:  Coronary Stent Intervention;  Surgeon: Tonny Bollman, MD;  Location: [facility] INVASIVE CV LAB;  Service: Cardiovascular;  Laterality: N/A;  aspiration thrombectomy    Current Outpatient Prescriptions  Medication Sig Dispense Refill  . aspirin EC 81 MG EC tablet Take 1 tablet (81 mg total) by mouth daily. 30 tablet 11  . clopidogrel (PLAVIX) 75 MG tablet Take 1 tablet (75 mg total) by mouth daily. 30 tablet 11  . metoprolol tartrate (LOPRESSOR) 25 MG tablet Take 0.5 tablets (12.5 mg total) by mouth 2 (two) times daily. 90 tablet 3  . nitroGLYCERIN (NITROSTAT) 0.4 MG SL tablet Place 1 tablet (0.4 mg total) under the tongue every 5 (five) minutes as needed for chest pain (up to 3 doses). (Patient not taking: Reported on 02/12/2015) 25 tablet 3   No current facility-administered medications for this visit.    Allergies:   Review of patient's allergies indicates no known allergies.   Social History:  The patient  reports that she has been smoking Cigarettes.  She has a 12 pack-year smoking history. She does not have any smokeless tobacco history on file. She reports that she does not drink alcohol or use illicit drugs.   Family History:  The patient's family history includes Asthma in her brother and brother; Healthy in her brother, brother, brother, brother, brother, sister, and sister; Heart disease in her paternal grandmother; Heart murmur in her cousin; Hyperlipidemia in her father; Hypertension in her mother; Stroke (age of onset: 54) in her sister.   ROS:  Please see the history of present  illness. All other systems are reviewed and negative.   PHYSICAL EXAM: VS:  BP 120/78 mmHg  Pulse 72  Ht $R'5\' 1"'UL$  (1.549 m)  Wt 233 lb (105.688 kg)  BMI 44.05 kg/m2  LMP 09/13/2014 (Approximate) , BMI Body mass index is 44.05 kg/(m^2). GEN: Well nourished, well developed, in no acute distress HEENT: normal Neck: no JVD, no masses. No carotid bruits Cardiac: RRR without murmur or gallop                  Respiratory:  clear to auscultation bilaterally, normal work of breathing GI: soft, nontender, nondistended, + BS MS: no deformity or atrophy Ext: no pretibial edema, pedal pulses 2+= bilaterally Skin: warm and dry, no rash Neuro:  Strength and sensation are intact Psych: euthymic mood, full affect  EKG:  EKG is ordered today. The ekg ordered today shows sinus bradycardia 56 bpm, age indeterminate inferior MI, otherwise within normal limits  Recent Labs: 09/11/2014: B Natriuretic Peptide 16.5 09/12/2014: ALT 21; Magnesium 1.8; TSH 0.495 09/24/2014: BUN 10; Creatinine, Ser 0.71; Potassium 3.9; Sodium 138 11/19/2014: Hemoglobin 8.2*; Platelets 188   Lipid Panel     Component Value Date/Time   CHOL 127 09/12/2014 0305   TRIG 62 09/12/2014 0305   HDL 29* 09/12/2014 0305   CHOLHDL 4.4 09/12/2014 0305   VLDL 12 09/12/2014 0305   LDLCALC 86 09/12/2014 0305      Wt Readings from Last 3 Encounters:  02/12/15 233 lb (105.688 kg)  11/18/14 239 lb 3.2 oz (108.5 kg)  09/24/14 229 lb 12.8 oz (104.237 kg)     Cardiac Studies Reviewed:   2D Echo 09/12/14 - Left ventricle: The cavity size was mildly dilated. Wall thickness was normal. Systolic function was normal. The estimated ejection fraction was in the range of 50% to 55%. Wall motion was normal; there were no regional wall motion abnormalities. Left ventricular diastolic function parameters were normal. - Mitral valve: There was mild regurgitation.  Cardiac Cath 09/12/2014: FINAL CONCLUSIONS:  TOTAL OCCLUSION OF THE RCA WITH EXTENSIVE THROMBUS  NORMAL LEFT MAIN, LAD, AND LCX  SUCCESSFUL PCI OF THE RCA WITH ASPIRATION THROMBECTOMY AND OVERLAPPING DES  RECOMMENDATIONS:  DAPT WITH ASA AND PLAVIX AT LEAST 12 MONTHS  AGGRESSIVE RISK REDUCTION  PLAVIX CHOSEN OVER MORE POTENT ANTIPLATELET DRUGS BECAUSE OF ANEMIA/HEAVY MENSTRUAL BLEEDING  ASSESSMENT AND PLAN: 1.  Chest pain associated with exertion: Pain has some typical  and atypical characteristics. Recommend a stress echocardiogram to evaluate for residual ischemia. Recommend start metoprolol 12.5 mg twice daily.  2. CAD status post MI: Stress echo assessment as above. The patient will continue on dual antiplatelet therapy with aspirin and Plavix. We discussed the benefit of a statin drug. She has been averse to lipid-lowering therapies in the past and has stopped taking this on her own. Will update a lipid panel and make further recommendations based on the results of her blood work.  3. Tobacco abuse: She smokes on occasion. Advised complete cessation.  Current medicines are reviewed with the patient today.  The patient does not have concerns regarding medicines.  Labs/ tests ordered today include:   Orders Placed This Encounter  Procedures  . CBC  . Comp Met (CMET)  . Lipid panel  . EKG 12-Lead  . Echo stress    Disposition:   FU 3 months with Richardson Dopp, PA-C and 6 months with me  Signed, Sherren Mocha, MD  02/14/2015 9:09 PM    [id number]  [address], [place], [place]  [address] Phone: [phone]; Fax: [phone]

## 2015-02-12 NOTE — Patient Instructions (Signed)
Medication Instructions:  Your physician has recommended you make the following change in your medication:  1. START Metoprolol Tartrate 25mg  take one-half tablet by mouth twice a day  Labwork: Your physician recommends that you return for a FASTING CBC, LIPID and CMP (same day as stress echo)--nothing to eat or drink after midnight  Testing/Procedures: Your physician has requested that you have a stress echocardiogram. For further information please visit [email or web address]. Please follow instruction sheet as given.  Follow-Up: Your physician recommends that you schedule a follow-up appointment in: 3 MONTHS with Richardson Dopp PA-C  Your physician wants you to follow-up in: Dr Burt Knack in [place].  You will receive a reminder letter in the mail two months in advance. If you don't receive a letter, please call our office to schedule the follow-up appointment.   Any Other Special Instructions Will Be Listed Below (If Applicable).     If you need a refill on your cardiac medications before your next appointment, please call your pharmacy.

## 2015-02-19 ENCOUNTER — Other Ambulatory Visit (INDEPENDENT_AMBULATORY_CARE_PROVIDER_SITE_OTHER): Payer: Medicaid Other | Admitting: *Deleted

## 2015-02-19 ENCOUNTER — Ambulatory Visit (HOSPITAL_COMMUNITY): Payer: Medicaid Other | Attending: Cardiology

## 2015-02-19 ENCOUNTER — Ambulatory Visit (HOSPITAL_BASED_OUTPATIENT_CLINIC_OR_DEPARTMENT_OTHER): Payer: Medicaid Other

## 2015-02-19 DIAGNOSIS — R0989 Other specified symptoms and signs involving the circulatory and respiratory systems: Secondary | ICD-10-CM

## 2015-02-19 DIAGNOSIS — I25119 Atherosclerotic heart disease of native coronary artery with unspecified angina pectoris: Secondary | ICD-10-CM

## 2015-02-19 DIAGNOSIS — I2510 Atherosclerotic heart disease of native coronary artery without angina pectoris: Secondary | ICD-10-CM | POA: Diagnosis present

## 2015-02-19 LAB — CBC
HEMATOCRIT: 28.2 % — AB (ref 36.0–46.0)
Hemoglobin: 8.4 g/dL — ABNORMAL LOW (ref 12.0–15.0)
MCH: 19.8 pg — ABNORMAL LOW (ref 26.0–34.0)
MCHC: 29.8 g/dL — ABNORMAL LOW (ref 30.0–36.0)
MCV: 66.5 fL — AB (ref 78.0–100.0)
PLATELETS: 191 10*3/uL (ref 150–400)
RBC: 4.24 MIL/uL (ref 3.87–5.11)
RDW: 20.6 % — ABNORMAL HIGH (ref 11.5–15.5)
WBC: 8.6 10*3/uL (ref 4.0–10.5)

## 2015-02-19 LAB — LIPID PANEL
CHOL/HDL RATIO: 4.5 ratio (ref <=5.0)
CHOLESTEROL: 126 mg/dL (ref 125–200)
HDL: 28 mg/dL — ABNORMAL LOW (ref >=46)
LDL Cholesterol: 83 mg/dL (ref <130)
Triglycerides: 75 mg/dL (ref <150)
VLDL: 15 mg/dL (ref <30)

## 2015-02-19 LAB — COMPREHENSIVE METABOLIC PANEL
ALT: 19 U/L (ref 6–29)
AST: 23 U/L (ref 10–30)
Albumin: 4.1 g/dL (ref 3.6–5.1)
Alkaline Phosphatase: 56 U/L (ref 33–115)
BUN: 10 mg/dL (ref 7–25)
CO2: 18 mmol/L — ABNORMAL LOW (ref 20–31)
CREATININE: 0.79 mg/dL (ref 0.50–1.10)
Calcium: 8.8 mg/dL (ref 8.6–10.2)
Chloride: 104 mmol/L (ref 98–110)
Glucose, Bld: 104 mg/dL — ABNORMAL HIGH (ref 65–99)
POTASSIUM: 3.4 mmol/L — AB (ref 3.5–5.3)
Sodium: 137 mmol/L (ref 135–146)
TOTAL PROTEIN: 7.5 g/dL (ref 6.1–8.1)
Total Bilirubin: 0.4 mg/dL (ref 0.2–1.2)

## 2015-02-19 NOTE — Addendum Note (Signed)
Addended by: Eulis Foster on: 02/19/2015 07:34 AM   Modules accepted: Orders

## 2015-02-21 ENCOUNTER — Other Ambulatory Visit: Payer: Self-pay

## 2015-02-21 DIAGNOSIS — E876 Hypokalemia: Secondary | ICD-10-CM

## 2015-02-21 MED ORDER — POTASSIUM CHLORIDE ER 10 MEQ PO TBCR: 10.0000 meq | EXTENDED_RELEASE_TABLET | Freq: Every day | ORAL | Status: DC

## 2015-03-24 ENCOUNTER — Other Ambulatory Visit: Payer: Medicaid Other

## 2015-05-12 ENCOUNTER — Ambulatory Visit: Payer: Medicaid Other | Admitting: Physician Assistant

## 2015-05-28 ENCOUNTER — Emergency Department (HOSPITAL_COMMUNITY)
Admission: EM | Admit: 2015-05-28 | Discharge: 2015-05-28 | Disposition: A | Discharge status: Home / Self Care | Payer: Medicaid Other | Attending: Emergency Medicine | Admitting: Emergency Medicine

## 2015-05-28 ENCOUNTER — Encounter (HOSPITAL_COMMUNITY): Payer: Self-pay

## 2015-05-28 DIAGNOSIS — J029 Acute pharyngitis, unspecified: Secondary | ICD-10-CM | POA: Diagnosis present

## 2015-05-28 DIAGNOSIS — I2510 Atherosclerotic heart disease of native coronary artery without angina pectoris: Secondary | ICD-10-CM | POA: Diagnosis not present

## 2015-05-28 DIAGNOSIS — J069 Acute upper respiratory infection, unspecified: Secondary | ICD-10-CM | POA: Insufficient documentation

## 2015-05-28 DIAGNOSIS — Z79899 Other long term (current) drug therapy: Secondary | ICD-10-CM | POA: Diagnosis not present

## 2015-05-28 DIAGNOSIS — E669 Obesity, unspecified: Secondary | ICD-10-CM | POA: Diagnosis not present

## 2015-05-28 DIAGNOSIS — Z7982 Long term (current) use of aspirin: Secondary | ICD-10-CM | POA: Insufficient documentation

## 2015-05-28 DIAGNOSIS — Z862 Personal history of diseases of the blood and blood-forming organs and certain disorders involving the immune mechanism: Secondary | ICD-10-CM | POA: Diagnosis not present

## 2015-05-28 DIAGNOSIS — Z7902 Long term (current) use of antithrombotics/antiplatelets: Secondary | ICD-10-CM | POA: Diagnosis not present

## 2015-05-28 DIAGNOSIS — F17210 Nicotine dependence, cigarettes, uncomplicated: Secondary | ICD-10-CM | POA: Insufficient documentation

## 2015-05-28 DIAGNOSIS — Z9889 Other specified postprocedural states: Secondary | ICD-10-CM | POA: Insufficient documentation

## 2015-05-28 DIAGNOSIS — Z8719 Personal history of other diseases of the digestive system: Secondary | ICD-10-CM | POA: Diagnosis not present

## 2015-05-28 DIAGNOSIS — R058 Other specified cough: Secondary | ICD-10-CM

## 2015-05-28 DIAGNOSIS — Z8742 Personal history of other diseases of the female genital tract: Secondary | ICD-10-CM | POA: Diagnosis not present

## 2015-05-28 DIAGNOSIS — I252 Old myocardial infarction: Secondary | ICD-10-CM | POA: Insufficient documentation

## 2015-05-28 DIAGNOSIS — J3489 Other specified disorders of nose and nasal sinuses: Secondary | ICD-10-CM

## 2015-05-28 DIAGNOSIS — R05 Cough: Secondary | ICD-10-CM

## 2015-05-28 LAB — RAPID STREP SCREEN (MED CTR MEBANE ONLY): STREPTOCOCCUS, GROUP A SCREEN (DIRECT): NEGATIVE

## 2015-05-28 MED ORDER — PHENOL 1.4 % MT LIQD
1.0000 | OROMUCOSAL | Status: DC | PRN
  Administered 2015-05-28: 1 via OROMUCOSAL
  Filled 2015-05-28: qty 177

## 2015-05-28 NOTE — ED Notes (Addendum)
Pt c/o "a litte" throat irritation x 1 day.  Pain score 5/10.  Sts "I feel myself come on w/ something.  I just want to make sure it isn't strep."  Clear voice noted.  Pt's son recently diagnosed w/ Strep.

## 2015-05-28 NOTE — ED Notes (Addendum)
Verbalized understanding discharge instructions. In no acute distress.   Pt concerned that boyfriend was diagnosed w/ a virus.  This RN educated her that we do not prescribe antibiotics for a virus and that she should stay hydrated and wash hands frequently.  Also, should she get uncontrolled n/v/d, then she should follow-up w/ her PCP.  Pt verbalized understanding.

## 2015-05-28 NOTE — ED Provider Notes (Signed)
CSN: [id number]     Arrival date & time 05/28/15  0716 History   First MD Initiated Contact with Patient 05/28/15 0720     Chief Complaint  Patient presents with  . Sore Throat     (Consider location/radiation/quality/duration/timing/severity/associated sxs/prior Treatment) HPI Comments: Terry Morgan is a 35 y.o. female with a PMHx of CAD and NSTEMI s/p DES placement, tobacco use, obesity, menorrhagia,  thrombocytosis, and GERD, who presents to the ED with complaints of one day of throat irritation. Patient states that her throat feels itchy and scratchy, describing as 5/10 constant nonradiating posterior throat irritation, worse with dust exposure at work, and with no treatments tried prior to arrival. Associated symptoms include very faintly green rhinorrhea, and dry cough. Positive smoker, positive sick contacts stating that her son was diagnosed with strep last week. She states she wants to make sure it's not strep, stating "I could just be paranoid but I will make sure." She denies any fevers, chills, ear pain or drainage, eye itching or redness, eye drainage, drooling, trismus, chest pain, shortness breath, wheezing, abdominal pain, nausea, vomiting, diarrhea, constipation, dysuria, hematuria, numbness, tingling, or focal weakness. No recent travel.  Patient is a 34 y.o. female presenting with pharyngitis. The history is provided by the patient and medical records. No language interpreter was used.  Sore Throat This is a new problem. The current episode started yesterday. The problem occurs constantly. The problem has been unchanged. Associated symptoms include coughing (dry) and a sore throat. Pertinent negatives include no abdominal pain, arthralgias, chest pain, chills, fever, myalgias, nausea, numbness, urinary symptoms, vomiting or weakness. Exacerbated by: dust exposure. She has tried nothing for the symptoms. The treatment provided no relief.    Past Medical History  Diagnosis Date   . Blood transfusion without reported diagnosis   . CAD (coronary artery disease)   . NSTEMI (non-ST elevated myocardial infarction) ([place])     a. NSTEMI 08/2014 due to acute occlusion of the RCA s/p aspiration thrombectomy and overlapping DES - EF 50-55%.  . Nausea   . Tobacco abuse   . Obesity   . Menorrhagia   . Thrombocytosis ([place]) 09/11/2014  . GERD (gastroesophageal reflux disease)    Past Surgical History  Procedure Laterality Date  . Cesarean section    . Cardiac catheterization N/A 09/12/2014    Procedure: Left Heart Cath and Coronary Angiography;  Surgeon: Sherren Mocha, MD;  Location: [facility] CV LAB;  Service: Cardiovascular;  Laterality: N/A;  . Cardiac catheterization N/A 09/12/2014    Procedure: Coronary Stent Intervention;  Surgeon: Sherren Mocha, MD;  Location: [facility] CV LAB;  Service: Cardiovascular;  Laterality: N/A;  aspiration thrombectomy   Family History  Problem Relation Age of Onset  . Hypertension Mother   . Hyperlipidemia Father   . Stroke Sister 60  . Healthy Brother   . Healthy Sister   . Healthy Sister   . Healthy Brother   . Healthy Brother   . Healthy Brother   . Healthy Brother   . Asthma Brother   . Asthma Brother   . Heart disease Paternal Grandmother   . Heart murmur Cousin    Social History  Substance Use Topics  . Smoking status: Current Some Day Smoker -- 1.00 packs/day for 12 years    Types: Cigarettes  . Smokeless tobacco: Not on file  . Alcohol Use: No   OB History    Gravida Para Term Preterm AB TAB SAB Ectopic Multiple Living  3 1 1  1 1    1      Review of Systems  Constitutional: Negative for fever and chills.  HENT: Positive for rhinorrhea and sore throat. Negative for drooling, ear discharge, ear pain and trouble swallowing.   Eyes: Negative for pain, discharge and itching.  Respiratory: Positive for cough (dry). Negative for shortness of breath and wheezing.   Cardiovascular: Negative for chest pain.   Gastrointestinal: Negative for nausea, vomiting, abdominal pain, diarrhea and constipation.  Genitourinary: Negative for dysuria, hematuria and vaginal discharge.  Musculoskeletal: Negative for myalgias and arthralgias.  Skin: Negative for color change.  Allergic/Immunologic: Negative for immunocompromised state.  Neurological: Negative for weakness and numbness.  Psychiatric/Behavioral: Negative for confusion.   10 Systems reviewed and are negative for acute change except as noted in the HPI.    Allergies  Review of patient's allergies indicates no known allergies.  Home Medications   Prior to Admission medications   Medication Sig Start Date End Date Taking? Authorizing Provider  aspirin EC 81 MG EC tablet Take 1 tablet (81 mg total) by mouth daily. 09/16/14   Dayna N Dunn, PA-C  clopidogrel (PLAVIX) 75 MG tablet Take 1 tablet (75 mg total) by mouth daily. 09/16/14   Dayna N Dunn, PA-C  metoprolol tartrate (LOPRESSOR) 25 MG tablet Take 0.5 tablets (12.5 mg total) by mouth 2 (two) times daily. 02/12/15   Sherren Mocha, MD  nitroGLYCERIN (NITROSTAT) 0.4 MG SL tablet Place 1 tablet (0.4 mg total) under the tongue every 5 (five) minutes as needed for chest pain (up to 3 doses). Patient not taking: Reported on 02/12/2015 09/16/14   Dayna N Dunn, PA-C  potassium chloride (K-DUR) 10 MEQ tablet Take 1 tablet (10 mEq total) by mouth daily. 02/21/15   Sherren Mocha, MD   BP 111/70 mmHg  Pulse 74  Temp(Src) 97.7 F (36.5 C) (Oral)  Resp 17  SpO2 98%  LMP 09/13/2014 (Approximate) Physical Exam  Constitutional: She is oriented to person, place, and time. Vital signs are normal. She appears well-developed and well-nourished.  Non-toxic appearance. No distress.  Afebrile, nontoxic, NAD  HENT:  Head: Normocephalic and atraumatic.  Right Ear: Hearing, tympanic membrane, external ear and ear canal normal.  Left Ear: Hearing, tympanic membrane, external ear and ear canal normal.  Nose: Mucosal  edema present. No rhinorrhea.  Mouth/Throat: Uvula is midline and mucous membranes are normal. No trismus in the jaw. No uvula swelling. Posterior oropharyngeal erythema present. No oropharyngeal exudate, posterior oropharyngeal edema or tonsillar abscesses.  Ears are clear bilaterally. Nose with minimal mucosal edema without rhinorrhea. Oropharynx mildly injected, without uvular swelling or deviation, no trismus or drooling, no tonsillar swelling, no exudates.  No PTA  Eyes: Conjunctivae and EOM are normal. Right eye exhibits no discharge. Left eye exhibits no discharge.  Neck: Normal range of motion. Neck supple.  Cardiovascular: Normal rate, regular rhythm, normal heart sounds and intact distal pulses.  Exam reveals no gallop and no friction rub.   No murmur heard. Pulmonary/Chest: Effort normal and breath sounds normal. No respiratory distress. She has no decreased breath sounds. She has no wheezes. She has no rhonchi. She has no rales.  CTAB in all lung fields, no w/r/r, no hypoxia or increased WOB, speaking in full sentences, SpO2 98% on RA  Abdominal: Soft. Normal appearance and bowel sounds are normal. She exhibits no distension. There is no tenderness. There is no rigidity, no rebound, no guarding and no CVA tenderness.  Musculoskeletal: Normal range of  motion.  Lymphadenopathy:       Head (right side): No submandibular and no tonsillar adenopathy present.       Head (left side): No submandibular and no tonsillar adenopathy present.    She has cervical adenopathy.  Shotty cervical LAD bilaterally which is nonTTP  Neurological: She is alert and oriented to person, place, and time. She has normal strength. No sensory deficit.  Skin: Skin is warm, dry and intact. No rash noted.  Psychiatric: She has a normal mood and affect.  Nursing note and vitals reviewed.   ED Course  Procedures (including critical care time) Labs Review Labs Reviewed  RAPID STREP SCREEN (NOT AT [facility])     Imaging Review No results found. I have personally reviewed and evaluated these images and lab results as part of my medical decision-making.   EKG Interpretation None      MDM   Final diagnoses:  Sore throat  Dry cough  Rhinorrhea  URI (upper respiratory infection)    35 y.o. female here with throat irritation/itching, rhinorrhea, and dry cough x1 day. +Sick contacts, here with her son, who was diagnosed with strep last week. She's concerned that she could be coming down with something. On exam, throat with minimal injection but no tonsillar swelling or exudates. Lung sounds clear. Doubt strep but pt very insistent that she have a strep test, which is reasonable given her son's +strep test. Will get this, and give chloraseptic spray. Will reassess shortly.   8:26 AM RST neg. Doubt need for empiric coverage, symptoms likely either viral or allergic. Pt is agreeable to symptomatic treatment with close follow up with PCP as needed but spoke at length about emergent changing or worsening of symptoms that should prompt return to ER. Pt voices understanding and is agreeable to plan. Stable at time of discharge.   BP 111/70 mmHg  Pulse 74  Temp(Src) 97.7 F (36.5 C) (Oral)  Resp 17  SpO2 98%  Breastfeeding? Unknown  Meds ordered this encounter  Medications  . phenol (CHLORASEPTIC) mouth spray 1 spray    Sig:      Kindal Ponti [facility]pr[facility], PA-C 05/28/15 [place], DO 05/28/15 [phone]

## 2015-05-28 NOTE — Discharge Instructions (Signed)
Continue to stay well-hydrated. Gargle warm salt water and spit it out. Use chloraseptic spray as needed for sore throat. Continue to alternate between Tylenol and Ibuprofen for pain or fever. Use Mucinex for cough suppression/expectoration of mucus. Use netipot and flonase to help with nasal congestion. May consider over-the-counter Benadryl or other antihistamine to decrease secretions, help with throat itchiness, and for watery itchy eyes. Followup with your primary care doctor in 5-7 days for recheck of ongoing symptoms. Return to emergency department for emergent changing or worsening of symptoms.   Viral Infections A virus is a type of germ. Viruses can cause:  Minor sore throats.  Aches and pains.  Headaches.  Runny nose.  Rashes.  Watery eyes.  Tiredness.  Coughs.  Loss of appetite.  Feeling sick to your stomach (nausea).  Throwing up (vomiting).  Watery poop (diarrhea). HOME CARE   Only take medicines as told by your doctor.  Drink enough water and fluids to keep your pee (urine) clear or pale yellow. Sports drinks are a good choice.  Get plenty of rest and eat healthy. Soups and broths with crackers or rice are fine. GET HELP RIGHT AWAY IF:   You have a very bad headache.  You have shortness of breath.  You have chest pain or neck pain.  You have an unusual rash.  You cannot stop throwing up.  You have watery poop that does not stop.  You cannot keep fluids down.  You or your child has a temperature by mouth above 102 F (38.9 C), not controlled by medicine.  Your baby is older than 3 months with a rectal temperature of 102 F (38.9 C) or higher.  Your baby is 73 months old or younger with a rectal temperature of 100.4 F (38 C) or higher. MAKE SURE YOU:   Understand these instructions.  Will watch this condition.  Will get help right away if you are not doing well or get worse.   This information is not intended to replace advice given to  you by your health care provider. Make sure you discuss any questions you have with your health care provider.   Document Released: 03/18/2008 Document Revised: 06/28/2011 Document Reviewed: 09/11/2014 Elsevier Interactive Patient Education 2016 Elsevier Inc.  Sore Throat A sore throat is a painful, burning, sore, or scratchy feeling of the throat. There may be pain or tenderness when swallowing or talking. You may have other symptoms with a sore throat. These include coughing, sneezing, fever, or a swollen neck. A sore throat is often the first sign of another sickness. These sicknesses may include a cold, flu, strep throat, or an infection called mono. Most sore throats go away without medical treatment.  HOME CARE   Only take medicine as told by your doctor.  Drink enough fluids to keep your pee (urine) clear or pale yellow.  Rest as needed.  Try using throat sprays, lozenges, or suck on hard candy (if older than 4 years or as told).  Sip warm liquids, such as broth, herbal tea, or warm water with honey. Try sucking on frozen ice pops or drinking cold liquids.  Rinse the mouth (gargle) with salt water. Mix 1 teaspoon salt with 8 ounces of water.  Do not smoke. Avoid being around others when they are smoking.  Put a humidifier in your bedroom at night to moisten the air. You can also turn on a hot shower and sit in the bathroom for 5-10 minutes. Be sure the bathroom door  is closed. GET HELP RIGHT AWAY IF:   You have trouble breathing.  You cannot swallow fluids, soft foods, or your spit (saliva).  You have more puffiness (swelling) in the throat.  Your sore throat does not get better in 7 days.  You feel sick to your stomach (nauseous) and throw up (vomit).  You have a fever or lasting symptoms for more than 2-3 days.  You have a fever and your symptoms suddenly get worse. MAKE SURE YOU:   Understand these instructions.  Will watch your condition.  Will get help right  away if you are not doing well or get worse.   This information is not intended to replace advice given to you by your health care provider. Make sure you discuss any questions you have with your health care provider.   Document Released: 01/13/2008 Document Revised: 12/29/2011 Document Reviewed: 12/12/2011 Elsevier Interactive Patient Education 2016 Elsevier Inc.  Cough, Adult A cough helps to clear your throat and lungs. A cough may last only 2-3 weeks (acute), or it may last longer than 8 weeks (chronic). Many different things can cause a cough. A cough may be a sign of an illness or another medical condition. HOME CARE  Pay attention to any changes in your cough.  Take medicines only as told by your doctor.  If you were prescribed an antibiotic medicine, take it as told by your doctor. Do not stop taking it even if you start to feel better.  Talk with your doctor before you try using a cough medicine.  Drink enough fluid to keep your pee (urine) clear or pale yellow.  If the air is dry, use a cold steam vaporizer or humidifier in your home.  Stay away from things that make you cough at work or at home.  If your cough is worse at night, try using extra pillows to raise your head up higher while you sleep.  Do not smoke, and try not to be around smoke. If you need help quitting, ask your doctor.  Do not have caffeine.  Do not drink alcohol.  Rest as needed. GET HELP IF:  You have new problems (symptoms).  You cough up yellow fluid (pus).  Your cough does not get better after 2-3 weeks, or your cough gets worse.  Medicine does not help your cough and you are not sleeping well.  You have pain that gets worse or pain that is not helped with medicine.  You have a fever.  You are losing weight and you do not know why.  You have night sweats. GET HELP RIGHT AWAY IF:  You cough up blood.  You have trouble breathing.  Your heartbeat is very fast.   This  information is not intended to replace advice given to you by your health care provider. Make sure you discuss any questions you have with your health care provider.   Document Released: 12/17/2010 Document Revised: 12/25/2014 Document Reviewed: 06/12/2014 Elsevier Interactive Patient Education [employer].

## 2015-05-30 LAB — CULTURE, GROUP A STREP (THRC)

## 2015-06-14 IMAGING — CR DG CHEST 2V
2 series · 2 of 2 positions shown · non-contrast
Comparison: None.

CLINICAL DATA: Centralized chest pain shortness of breath for 1
week. History of smoking.

EXAM:
CHEST  2 VIEW

[w chest pa]
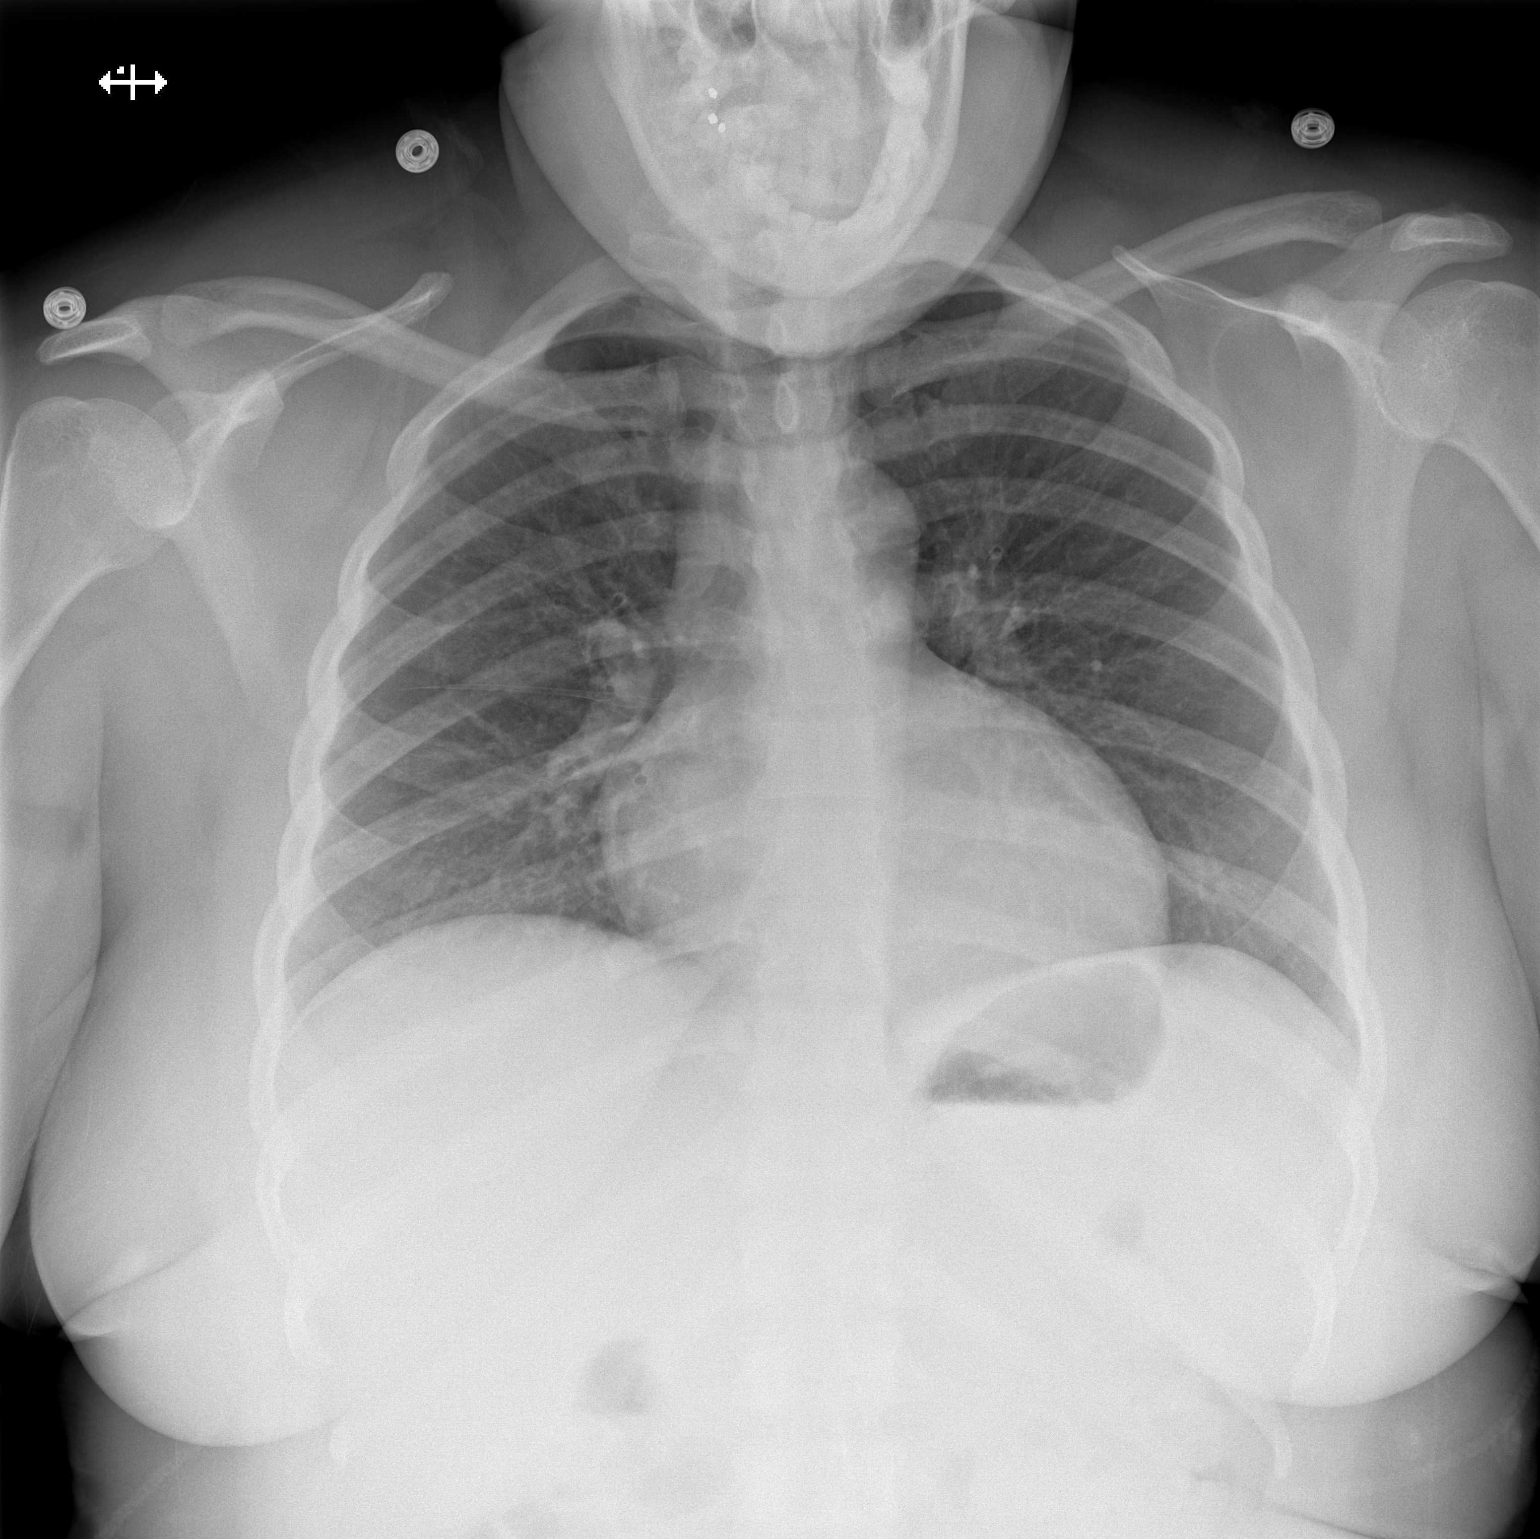

[w chest lat]
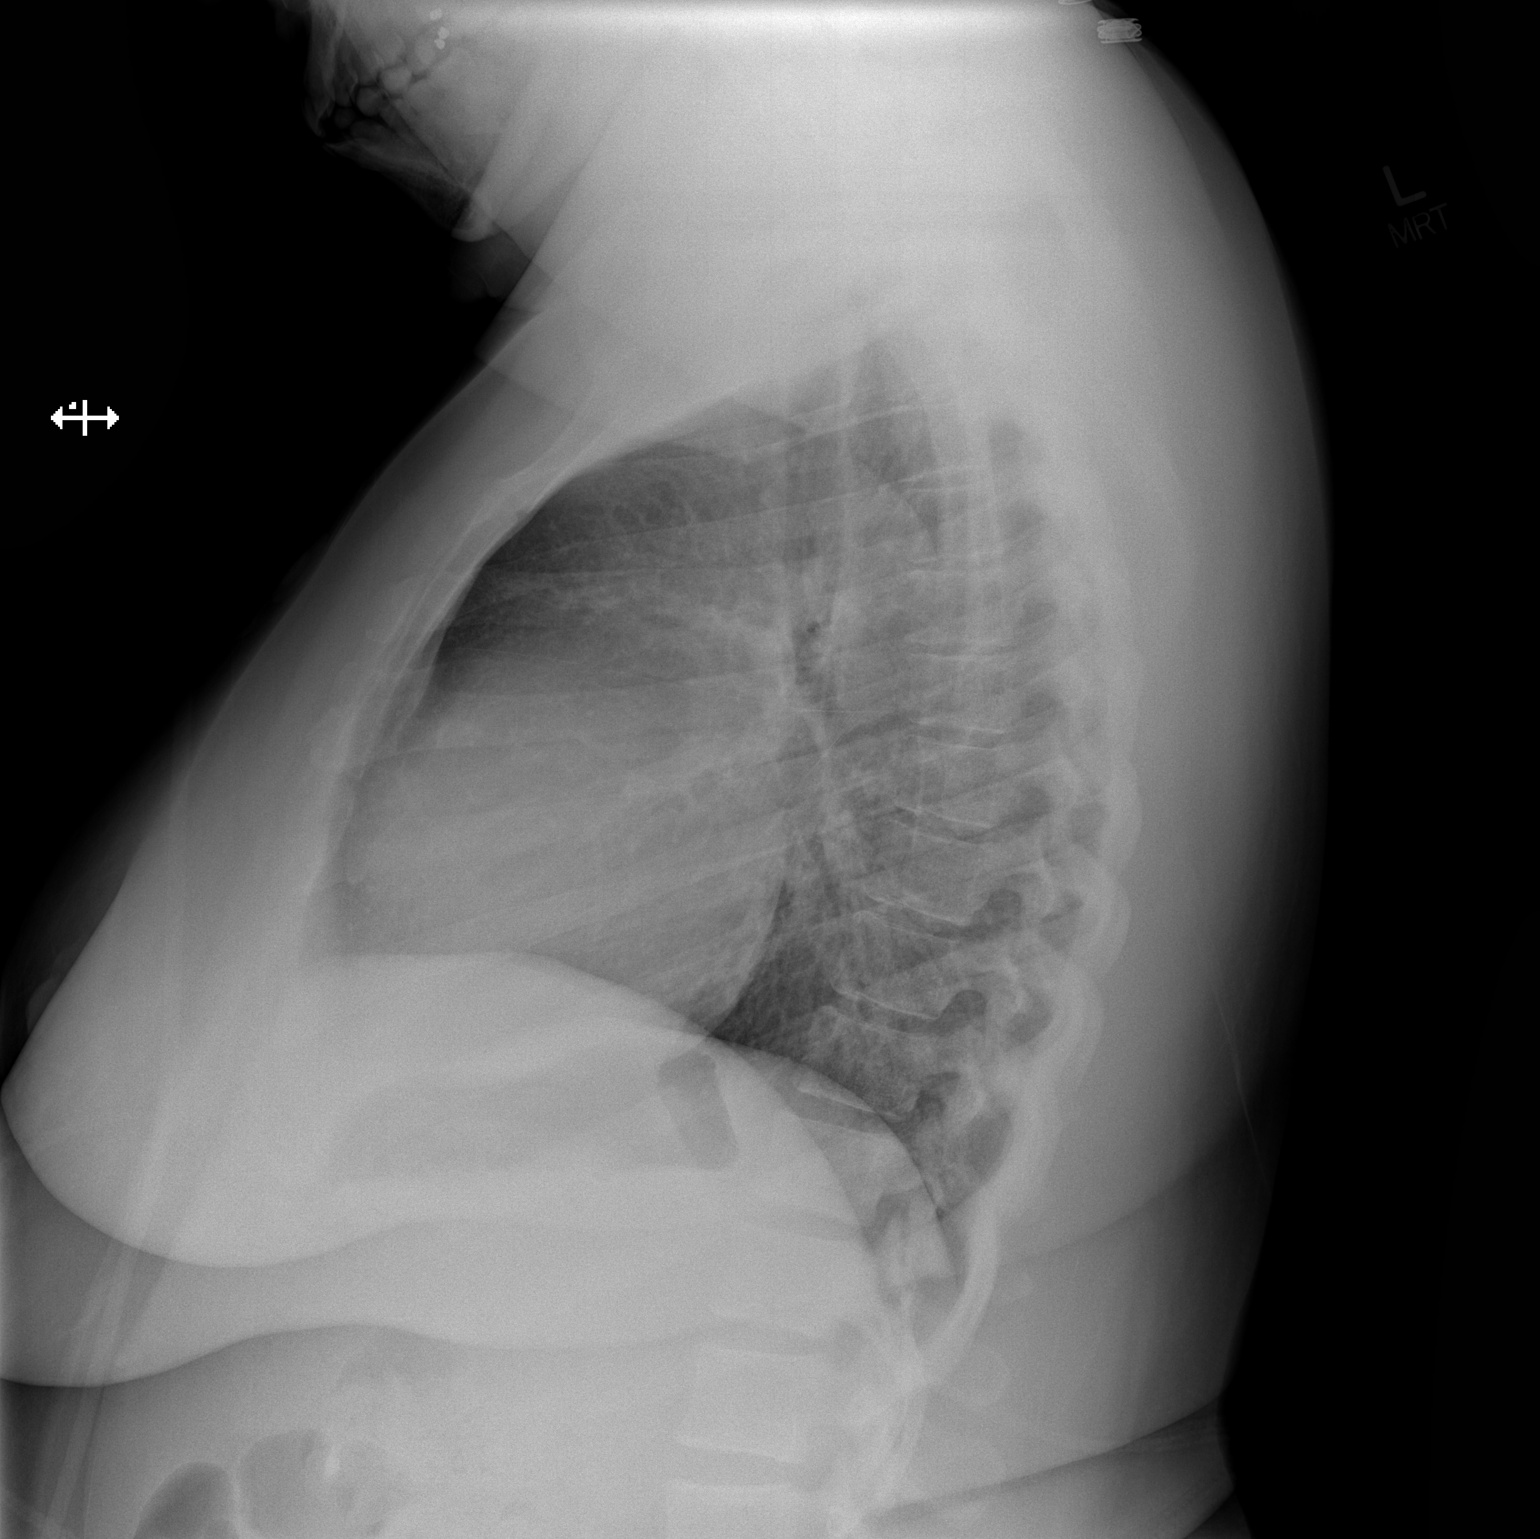

[2 of 2 positions shown; findings below may reference images not displayed]

FINDINGS: Borderline enlarged cardiac silhouette. Normal mediastinal contours.
Minimal bilateral infrahilar heterogeneous opacities favored to
represent atelectasis. No discrete focal airspace opacities. There
is mild diffuse slightly nodular thickening of the pulmonary
interstitium. There is minimal pleural parenchymal thickening about
the right minor fissure. No pleural effusion or pneumothorax. No
evidence of edema. No acute osseus abnormalities.
IMPRESSION: Findings suggestive of airways disease / bronchitis. No focal
airspace opacities to suggest pneumonia.

## 2015-07-19 ENCOUNTER — Encounter (HOSPITAL_COMMUNITY): Payer: Self-pay | Admitting: Emergency Medicine

## 2015-07-19 ENCOUNTER — Emergency Department (HOSPITAL_COMMUNITY)
Admission: EM | Admit: 2015-07-19 | Discharge: 2015-07-19 | Disposition: A | Discharge status: Home / Self Care | Payer: Medicaid Other | Attending: Emergency Medicine | Admitting: Emergency Medicine

## 2015-07-19 DIAGNOSIS — F17210 Nicotine dependence, cigarettes, uncomplicated: Secondary | ICD-10-CM | POA: Diagnosis not present

## 2015-07-19 DIAGNOSIS — M79641 Pain in right hand: Secondary | ICD-10-CM | POA: Insufficient documentation

## 2015-07-19 DIAGNOSIS — I2510 Atherosclerotic heart disease of native coronary artery without angina pectoris: Secondary | ICD-10-CM | POA: Diagnosis not present

## 2015-07-19 DIAGNOSIS — I252 Old myocardial infarction: Secondary | ICD-10-CM | POA: Insufficient documentation

## 2015-07-19 DIAGNOSIS — R200 Anesthesia of skin: Secondary | ICD-10-CM | POA: Diagnosis not present

## 2015-07-19 DIAGNOSIS — G629 Polyneuropathy, unspecified: Secondary | ICD-10-CM | POA: Insufficient documentation

## 2015-07-19 DIAGNOSIS — Z862 Personal history of diseases of the blood and blood-forming organs and certain disorders involving the immune mechanism: Secondary | ICD-10-CM | POA: Insufficient documentation

## 2015-07-19 DIAGNOSIS — Z7982 Long term (current) use of aspirin: Secondary | ICD-10-CM | POA: Insufficient documentation

## 2015-07-19 DIAGNOSIS — Z8719 Personal history of other diseases of the digestive system: Secondary | ICD-10-CM | POA: Diagnosis not present

## 2015-07-19 DIAGNOSIS — E669 Obesity, unspecified: Secondary | ICD-10-CM | POA: Diagnosis not present

## 2015-07-19 NOTE — ED Notes (Addendum)
Pt came up to nurse first after being discharged stating that her workers comp paperwork was not filled out and given back to her. RN taking care of pt had already explained to pt that the ED physician (Dr. Christy Gentles) advised pt that the specialist needed to fill out her paperwork. Pt upset that her paperwork was "found" in a separate location from where she was triaged. Pt was advised that neither myself, the triage RN, or triage phlebotomist was handed any paperwork. Pt is irate and being rude to staff and this Probation officer wanting to file a complaint. Pt was given resolution of complaint paperwork by RN.

## 2015-07-19 NOTE — ED Notes (Addendum)
Pt.'s concerns were reported to charge nurse by her primary nurse .

## 2015-07-19 NOTE — ED Notes (Signed)
Splint replaced with the ED providers permission.

## 2015-07-19 NOTE — ED Notes (Addendum)
Pt. asked the writer if I have her workman's comp documents , nurse explained to pt. that she did not handed any papers to him nor advised the nurse that she has documents that needed to be filled out for his employer during triage . The documents were found at the top of registration desk by her primary RN Fredrich Birks) after searching the triage area and registration area after her discharge. Pt. is concerned that her private information were exposed to public.

## 2015-07-19 NOTE — ED Notes (Addendum)
This RN applied wrist/forearm split to the pt. Pt asked this RN if she had to wear the split to work. This RN informed the patient that she did indeed have to wear the splint to work. The pt then requested a different type of splint splint. This RN then replaced the patient splint with a smaller wrist splint that does not go as far down on the forearm. The patient was satisfied. The pt was informed that she could get dressed and informed on how to exit the ER after getting dressed. As this RN went to exit the room the pt requested her workers comp paperwork back. This RN informed the pt that she was never given any paperwork. This RN walked the pt out to the waiting room and attempted to locate who she gave her paperwork to. Pt stating that her social security number and personal information hs been exposed. Pt informed that this was not the case. Her paper work was in a secure area in triage when only staff have access to. No other pt has access to the paperwork. This RN did not receive the Workers comp paperwork prior to locating them in the separate (nursing) triage area. The paperwork never made it into the pt room. This RN took the paperwork to the MD and the MD informed this RN that the specialist is who has to fill out the workers comp paperwork.Pt stated that her job told her to fill out the workers comp paperwork at the emergency room. Pt requesting that someone right a letter stating that the specialist had to fill out the workers comp paperwork. This RN originally told the pt that she would write up a statement and after talking to the Charge RN that we do not do that. Pt informed that this RN is unable to feel out a piece of paper stating that the specialist is supposed to feel out the paperwork. Pt not able to tell this Rn specifically who she gave the paperwork to. Pt states "I gave it to somebody, wither the asian guy who asked me the questions or the personal that took my vitals."   Pt now talking  to security. Pt asking for several staff members names. Agricultural consultant notified.  Pt returned to the patient care area and requested this RN name and the location of where the paperwork was found. Pt updated on current situation and informed that this RN is unaware of how long the Charge RN will be due to constant incoming EMSs that she has to take report of.

## 2015-07-19 NOTE — ED Notes (Addendum)
See notes under chart review.   Wrist splint changed with MD permission

## 2015-07-19 NOTE — ED Notes (Signed)
Pt. 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

## 2015-07-19 NOTE — ED Provider Notes (Signed)
CSN: [id number]     Arrival date & time 07/19/15  0117 History   First MD Initiated Contact with Patient 07/19/15 0430     Chief Complaint  Patient presents with  . Hand Pain    Patient is a 35 y.o. female presenting with hand pain. The history is provided by the patient.  Hand Pain This is a chronic problem. The current episode started more than 1 week ago. The problem occurs daily. The problem has been gradually worsening. Pertinent negatives include no chest pain, no headaches and no shortness of breath. Exacerbated by: repetitive work. The symptoms are relieved by rest.  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 Blood transfusion without reported diagnosis   . CAD (coronary artery disease)   . NSTEMI (non-ST elevated myocardial infarction) ([place])     a. NSTEMI 08/2014 due to acute occlusion of the RCA s/p aspiration thrombectomy and overlapping DES - EF 50-55%.  . Nausea   . Tobacco abuse   . Obesity   . Menorrhagia   . Thrombocytosis ([place]) 09/11/2014  . GERD (gastroesophageal reflux disease)    Past Surgical History  Procedure Laterality Date  . Cesarean section    . Cardiac catheterization N/A 09/12/2014    Procedure: Left Heart Cath and Coronary Angiography;  Surgeon: Sherren Mocha, MD;  Location: [facility];  Service: Cardiovascular;  Laterality: N/A;  . Cardiac catheterization N/A 09/12/2014    Procedure: Coronary Stent Intervention;  Surgeon: Sherren Mocha, MD;  Location: [facility];  Service: Cardiovascular;  Laterality: N/A;  aspiration thrombectomy   Family History  Problem Relation Age  of Onset  . Hypertension Mother   . Hyperlipidemia Father   . Stroke Sister 60  . Healthy Brother   . Healthy Sister   . Healthy Sister   . Healthy Brother   . Healthy Brother   . Healthy Brother   . Healthy Brother   . Asthma Brother   . Asthma Brother   . Heart disease Paternal Grandmother   . Heart murmur Cousin    Social History  Substance Use Topics  . Smoking status: Current Every Day Smoker -- 0.00 packs/day for 12 years    Types: Cigarettes  . Smokeless tobacco: None  . Alcohol Use: No   OB History    Gravida Para Term Preterm AB TAB SAB Ectopic Multiple Living   3 1 1  1 1    1      Review of Systems  Constitutional: Negative for fever.  Respiratory: Negative for shortness of breath.   Cardiovascular: Negative for chest pain.  Musculoskeletal: Positive for arthralgias. Negative for neck pain.  Neurological: Positive for numbness. Negative for weakness and headaches.      Allergies  Review of patient's allergies indicates no known allergies.  Home Medications   Prior to Admission medications   Medication Sig Start Date End Date Taking? Authorizing Provider  aspirin EC 81 MG EC tablet Take 1 tablet (81 mg total) by mouth daily. 09/16/14  Yes Dayna N Dunn, PA-C   BP 117/95 mmHg  Pulse 59  Temp(Src) 98.1  F (36.7 C) (Oral)  Resp 18  Ht 5' (1.524 m)  Wt 104.781 kg  BMI 45.11 kg/m2  SpO2 100%  LMP 07/05/2015 Physical Exam CONSTITUTIONAL: Well developed/well nourished HEAD: Normocephalic/atraumatic EYES: EOMI ENMT: Mucous membranes moist NECK: supple no meningeal signs SPINE/BACK:no cspine tenderness CV: S1/S2 noted, no murmurs/rubs/gallops noted LUNGS: Lungs are clear to auscultation bilaterally, no apparent distress ABDOMEN: soft, nontender NEURO: Pt is awake/alert/appropriate, moves all extremitiesx4.  No facial droop.  Equal hand grips.  Equal power (5/5) with hand grip, wrist flex/extension, elbow flex/extension, and equal power with shoulder  abduction/adduction.  No focal sensory deficit to light touch is noted in either UE.   Equal (2+) biceps/brachioradialis reflex in bilateral UE EXTREMITIES: pulses normal/equal, full ROM.  Tenderness over palmar surface of both hands.  Equal pulses.  No erythema/edema.  +phalen maneuver noted.  No deformity noted.   SKIN: warm, color normal PSYCH: no abnormalities of mood noted, alert and oriented to situation  ED Course  Procedures pt with bilateral hand pain, right>left for 3 months.  She has intermittent numbness to her hands at times.  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  I advised light duty without repetitive motion at work until seen by specialist  i will defer further testing/evaluation and long term treatment to specialist.  MDM   Final diagnoses:  Neuropathy ([place])  Right hand pain    Nursing notes including past medical history and social history reviewed and considered in documentation     Ripley Fraise, MD 07/19/15 [phone]

## 2015-09-08 ENCOUNTER — Ambulatory Visit (INDEPENDENT_AMBULATORY_CARE_PROVIDER_SITE_OTHER): Payer: Medicaid Other | Admitting: Neurology

## 2015-09-08 ENCOUNTER — Encounter: Payer: Self-pay | Admitting: Neurology

## 2015-09-08 VITALS — BP 152/86 | HR 62 | Resp 16 | Ht 61.0 in | Wt 235.0 lb

## 2015-09-08 DIAGNOSIS — G5603 Carpal tunnel syndrome, bilateral upper limbs: Secondary | ICD-10-CM

## 2015-09-08 DIAGNOSIS — R200 Anesthesia of skin: Secondary | ICD-10-CM | POA: Diagnosis not present

## 2015-09-08 DIAGNOSIS — E669 Obesity, unspecified: Secondary | ICD-10-CM

## 2015-09-08 DIAGNOSIS — G5600 Carpal tunnel syndrome, unspecified upper limb: Secondary | ICD-10-CM | POA: Insufficient documentation

## 2015-09-08 MED ORDER — MELOXICAM 7.5 MG PO TABS: 7.5000 mg | ORAL_TABLET | Freq: Every day | ORAL | Status: DC

## 2015-09-08 MED ORDER — PREDNISONE 10 MG (21) PO TBPK: 10.0000 mg | ORAL_TABLET | Freq: Every day | ORAL | Status: DC
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
## 2015-09-09 ENCOUNTER — Ambulatory Visit: Payer: Medicaid Other | Admitting: Diagnostic Neuroimaging

## 2015-09-24 ENCOUNTER — Telehealth: Payer: Self-pay | Admitting: Neurology

## 2015-09-24 ENCOUNTER — Ambulatory Visit (INDEPENDENT_AMBULATORY_CARE_PROVIDER_SITE_OTHER): Payer: Self-pay | Admitting: Neurology

## 2015-09-24 ENCOUNTER — Ambulatory Visit (INDEPENDENT_AMBULATORY_CARE_PROVIDER_SITE_OTHER): Payer: Medicaid Other | Admitting: Neurology

## 2015-09-24 DIAGNOSIS — R200 Anesthesia of skin: Secondary | ICD-10-CM

## 2015-09-24 DIAGNOSIS — Z0289 Encounter for other administrative examinations: Secondary | ICD-10-CM

## 2015-09-24 DIAGNOSIS — G5603 Carpal tunnel syndrome, bilateral upper limbs: Secondary | ICD-10-CM

## 2015-09-24 NOTE — Telephone Encounter (Signed)
Patient called 1:53:35pm to advise she overslept and is running late, states she works 3rd shift. Is currently on [address] [place], should arrive around 2:05pm for 2:00pm appointment for NCS/EMG. Patient advised, she will still be seen up to 10 minutes after appointment time, beyond that she would need to be re-scheduled.

## 2015-09-24 NOTE — Telephone Encounter (Signed)
Noted/fim 

## 2015-09-24 NOTE — Progress Notes (Signed)
   STUDY DATE: 09/24/2015  PATIENT NAME: Terry Morgan DOB: 1981-04-10 MRN: [id number]  HISTORY:  Terry Morgan is a 35 year old woman with numbness in her hands, right greater than left.  She has bilateral Tinel signs, worse on the right.  NERVE CONDUCTION STUDIES:  The right median motor response had a delayed distal latency (4.3 mS) with normal forearm conduction and normal amplitude. The right median sensory response (4.2 mS) also had a delayed distal latency with normal amplitude.   Bilateral median F wave responses were normal.  The left median motor and sensory responses were normal.  Bilateral ulnar motor and sensory responses were normal.  Bilateral ulnar F-wave responses were normal.  EMG STUDIES:  Needle EMG of the right arm was performed.    There were a few polyphasic motor units with normal recruitment in the deltoid and biceps muscles. Triceps, pronator teres, extensor digitorum communis,  first dorsal interosseous and abductor pollicis brevis muscles were normal. There was no spontaneous activity in any of the muscles.  IMPRESSION:  This NCV/EMG shows mild to moderate right median neuropathy at the wrist (carpal tunnel syndrome)   Terry Morgan A. Felecia Shelling, MD, PhD Certified in Neurology, [place] Neurophysiology, Sleep Medicine, Pain Medicine and Neuroimaging  [facility] [address], [place], [place] [address] [phone]

## 2015-09-25 ENCOUNTER — Other Ambulatory Visit: Payer: Self-pay | Admitting: *Deleted

## 2015-09-25 ENCOUNTER — Telehealth: Payer: Self-pay | Admitting: *Deleted

## 2015-09-25 DIAGNOSIS — G5601 Carpal tunnel syndrome, right upper limb: Secondary | ICD-10-CM

## 2015-09-25 NOTE — Telephone Encounter (Signed)
Procedure note/results from 09-24-15 NCV/EMG faxed to Dr. Bernadette Hoit, fax # [phone], with fax confirmation received/fim

## 2015-10-01 ENCOUNTER — Telehealth: Payer: Self-pay | Admitting: Neurology

## 2015-10-01 NOTE — Telephone Encounter (Signed)
Noted/fim 

## 2015-10-01 NOTE — Telephone Encounter (Signed)
[facility] relayed patient has declined referral at this time.

## 2016-02-14 ENCOUNTER — Ambulatory Visit (HOSPITAL_COMMUNITY)
Admission: EM | Admit: 2016-02-14 | Discharge: 2016-02-14 | Disposition: A | Discharge status: Home / Self Care | Payer: Medicaid Other | Attending: Internal Medicine | Admitting: Internal Medicine

## 2016-02-14 ENCOUNTER — Telehealth (HOSPITAL_COMMUNITY): Payer: Self-pay | Admitting: Emergency Medicine

## 2016-02-14 ENCOUNTER — Encounter (HOSPITAL_COMMUNITY): Payer: Self-pay | Admitting: *Deleted

## 2016-02-14 DIAGNOSIS — S39012A Strain of muscle, fascia and tendon of lower back, initial encounter: Secondary | ICD-10-CM

## 2016-02-14 DIAGNOSIS — M542 Cervicalgia: Secondary | ICD-10-CM | POA: Diagnosis not present

## 2016-02-14 MED ORDER — DICLOFENAC POTASSIUM 50 MG PO TABS: 50.0000 mg | ORAL_TABLET | Freq: Three times a day (TID) | ORAL | 0 refills | Status: DC
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
## 2016-02-14 NOTE — Telephone Encounter (Signed)
Terry Morgan ([facility]) called stating Medicaid will not cover Diclofenac 50 mg but if provider can switch it to 75 mg it would be appreciated since that is on the $4 dollar list  Provider in room w/pt.

## 2016-02-14 NOTE — Discharge Instructions (Signed)
Ice to the areas of soreness to the right side of the neck and low back for the first 2 days then apply heat. Take the medication as directed for inflammation and pain.

## 2020-06-23 ENCOUNTER — Other Ambulatory Visit: Payer: Self-pay

## 2020-06-23 ENCOUNTER — Ambulatory Visit (HOSPITAL_COMMUNITY)
Admission: EM | Admit: 2020-06-23 | Discharge: 2020-06-23 | Disposition: A | Discharge status: Home / Self Care | Payer: Medicaid Other | Attending: Medical Oncology | Admitting: Medical Oncology

## 2020-06-23 ENCOUNTER — Encounter (HOSPITAL_COMMUNITY): Payer: Self-pay

## 2020-06-23 DIAGNOSIS — Z7982 Long term (current) use of aspirin: Secondary | ICD-10-CM | POA: Insufficient documentation

## 2020-06-23 DIAGNOSIS — Z79899 Other long term (current) drug therapy: Secondary | ICD-10-CM | POA: Diagnosis not present

## 2020-06-23 DIAGNOSIS — R0981 Nasal congestion: Secondary | ICD-10-CM

## 2020-06-23 DIAGNOSIS — R519 Headache, unspecified: Secondary | ICD-10-CM | POA: Diagnosis not present

## 2020-06-23 DIAGNOSIS — R059 Cough, unspecified: Secondary | ICD-10-CM | POA: Diagnosis not present

## 2020-06-23 DIAGNOSIS — Z20822 Contact with and (suspected) exposure to covid-19: Secondary | ICD-10-CM | POA: Insufficient documentation

## 2020-06-23 DIAGNOSIS — J029 Acute pharyngitis, unspecified: Secondary | ICD-10-CM | POA: Insufficient documentation

## 2020-06-23 DIAGNOSIS — Z87891 Personal history of nicotine dependence: Secondary | ICD-10-CM | POA: Insufficient documentation

## 2020-06-23 MED ORDER — FLUTICASONE PROPIONATE 50 MCG/ACT NA SUSP: 2.0000 | Freq: Every day | NASAL | 0 refills | Status: DC

## 2020-06-23 MED ORDER — BENZONATATE 100 MG PO CAPS: 100.0000 mg | ORAL_CAPSULE | Freq: Three times a day (TID) | ORAL | 0 refills | Status: DC

## 2020-06-23 NOTE — ED Triage Notes (Signed)
Pt reports being exposed to mold. Pt states she has been having an itchy throat, coughing, nasal congestion, headaches, watery eyes X 1 week.

## 2020-06-23 NOTE — ED Provider Notes (Signed)
[place]    CSN: [id number] Arrival date & time: 06/23/20  1945      History   Chief Complaint Chief Complaint  Patient presents with  . Sore Throat  . Cough  . Nasal Congestion  . Headache  . Eye Problem    Watery eyes     HPI JUBILEE VIVERO is a 40 y.o. female.  Patient presents with her son. She states that she is concerned that her apartment has mold in it and that is causing her current symptoms. For the past week she has been having itchy throat, dry cough, nasal congestion, sinus headaches, watery eyes. She states that she does not normally does not have trouble with seasonal allergies so she doubts this is the cause. She has not been around anyone with COVID-19 according to patient but she is willing to get COVID-19 testing to ensure that this is not the cause of her symptoms. She has not been taking anything for symptoms. No fevers, shortness of breath or wheezing.  HPI  Past Medical History:  Diagnosis Date  . Blood transfusion without reported diagnosis   . CAD (coronary artery disease)   . GERD (gastroesophageal reflux disease)   . Menorrhagia   . Nausea   . NSTEMI (non-ST elevated myocardial infarction) ([place])    a. NSTEMI 08/2014 due to acute occlusion of the RCA s/p aspiration thrombectomy and overlapping DES - EF 50-55%.  . Obesity   . Thrombocytosis 09/11/2014  . Tobacco abuse     Patient Active Problem List   Diagnosis Date Noted  . Numbness 09/08/2015  . Carpal tunnel syndrome 09/08/2015  . H. pylori infection 09/24/2014  . Obesity   . Tobacco abuse   . CAD (coronary artery disease)   . Nausea & vomiting 09/15/2014  . GERD (gastroesophageal reflux disease) 09/15/2014  . Odynophagia 09/15/2014  . Constipation 09/15/2014  . Menorrhagia 09/14/2014  . Sinus bradycardia 09/14/2014  . NSTEMI (non-ST elevated myocardial infarction) ([place]) 09/11/2014  . Chest pain 09/11/2014  . Anemia 09/11/2014  . Thrombocytosis 09/11/2014    Past  Surgical History:  Procedure Laterality Date  . CARDIAC CATHETERIZATION N/A 09/12/2014   Procedure: Left Heart Cath and Coronary Angiography;  Surgeon: Sherren Mocha, MD;  Location: [place] [facility];  Service: Cardiovascular;  Laterality: N/A;  . CARDIAC CATHETERIZATION N/A 09/12/2014   Procedure: Coronary Stent Intervention;  Surgeon: Sherren Mocha, MD;  Location: [place] [facility];  Service: Cardiovascular;  Laterality: N/A;  aspiration thrombectomy  . CESAREAN SECTION      OB History    Gravida  3   Para  1   Term  1   Preterm      AB  1   Living  1     SAB      IAB  1   Ectopic      Multiple      Live Births               Home Medications    Prior to Admission medications   Medication Sig Start Date End Date Taking? Authorizing Provider  benzonatate (TESSALON) 100 MG capsule Take 1 capsule (100 mg total) by mouth every 8 (eight) hours. 06/23/20  Yes Beren Yniguez M, PA-C  fluticasone [facility]) 50 MCG/ACT nasal spray Place 2 sprays into both nostrils daily. 06/23/20  Yes Hughie Closs, PA-C  aspirin EC 81 MG EC tablet Take 1 tablet (81 mg total) by mouth daily. 09/16/14  Charlie Pitter, PA-C  diclofenac (CATAFLAM) 50 MG tablet Take 1 tablet (50 mg total) by mouth 3 (three) times daily. One tablet TID with food prn pain. 02/14/16   Janne Napoleon, NP    Family History Family History  Problem Relation Age of Onset  . Hypertension Mother   . Hyperlipidemia Father   . Stroke Sister 27  . Healthy Brother   . Healthy Sister   . Healthy Sister   . Healthy Brother   . Healthy Brother   . Healthy Brother   . Healthy Brother   . Asthma Brother   . Asthma Brother   . Heart disease Paternal Grandmother   . Heart murmur Cousin     Social History Social History   Tobacco Use  . Smoking status: Former Smoker    Packs/day: 0.00    Years: 12.00    Pack years: 0.00    Types: Cigarettes  . Smokeless tobacco: Never Used  Substance Use Topics  . Alcohol  use: No    Alcohol/week: 0.0 standard drinks  . Drug use: No     Allergies   Patient has no known allergies.   Review of Systems Review of Systems  As stated above in HPI Physical Exam Triage Vital Signs ED Triage Vitals  Enc Vitals Group     BP 06/23/20 2004 130/77     Pulse Rate 06/23/20 2004 64     Resp 06/23/20 2004 18     Temp 06/23/20 2004 98.6 F (37 C)     Temp Source 06/23/20 2004 Oral     SpO2 06/23/20 2004 100 %     Weight --      Height --      Head Circumference --      Peak Flow --      Pain Score 06/23/20 2002 0     Pain Loc --      Pain Edu? --      Excl. in [place]? --    No data found.  Updated Vital Signs BP 130/77 (BP Location: Right Arm)   Pulse 64   Temp 98.6 F (37 C) (Oral)   Resp 18   LMP 06/03/2020 (Exact Date)   SpO2 100%   Physical Exam Vitals and nursing note reviewed.  Constitutional:      General: She is not in acute distress.    Appearance: She is well-developed. She is obese. She is not ill-appearing, toxic-appearing or diaphoretic.  HENT:     Head: Normocephalic and atraumatic.     Right Ear: Ear canal normal. No drainage, swelling or tenderness. A middle ear effusion is present. Tympanic membrane is not erythematous.     Left Ear: Ear canal normal. No drainage, swelling or tenderness. A middle ear effusion is present. Tympanic membrane is not erythematous.     Nose: Congestion present. No rhinorrhea.     Mouth/Throat:     Mouth: Mucous membranes are moist. No oral lesions.     Pharynx: Oropharynx is clear. Uvula midline. No pharyngeal swelling, oropharyngeal exudate, posterior oropharyngeal erythema or uvula swelling.     Tonsils: No tonsillar exudate or tonsillar abscesses.  Eyes:     Conjunctiva/sclera: Conjunctivae normal.     Pupils: Pupils are equal, round, and reactive to light.  Cardiovascular:     Rate and Rhythm: Normal rate and regular rhythm.     Heart sounds: Normal heart sounds.  Pulmonary:     Effort:  Pulmonary effort is normal. No  respiratory distress.     Breath sounds: Normal breath sounds. No stridor. No wheezing, rhonchi or rales.  Chest:     Chest wall: No tenderness.  Musculoskeletal:     Cervical back: Normal range of motion and neck supple.  Lymphadenopathy:     Cervical: No cervical adenopathy.  Skin:    Findings: No rash.      UC Treatments / Results  Labs (all labs ordered are listed, but only abnormal results are displayed) Labs Reviewed  SARS CORONAVIRUS 2 (TAT 6-24 HRS)    EKG   Radiology No results found.  Procedures Procedures (including critical care time)  Medications Ordered in UC Medications - No data to display  Initial Impression / Assessment and Plan / UC Course  I have reviewed the triage vital signs and the nursing notes.  Pertinent labs & imaging results that were available during my care of the patient were reviewed by me and considered in my medical decision making (see chart for details).    New. Appears allergic in nature versus viral. I discussed with patient that it is nearly impossible to tell if this is related to seasonal allergies versus potential mold exposure versus other virus however we will swab for COVID-19. In the meantime treating with Tessalon and Flonase. Discussed red flag signs and symptoms. Final Clinical Impressions(s) / UC Diagnoses   Final diagnoses:  Nasal congestion  Cough   Discharge Instructions   None    ED Prescriptions    Medication Sig Dispense Auth. Provider   benzonatate (TESSALON) 100 MG capsule Take 1 capsule (100 mg total) by mouth every 8 (eight) hours. 21 capsule Makaylie Dedeaux M, PA-C   fluticasone [facility]) 50 MCG/ACT nasal spray Place 2 sprays into both nostrils daily. 16 g Trevor Duty M, [place]     PDMP not reviewed this encounter.   Hughie Closs, [place] 06/23/20 2028

## 2020-06-24 LAB — SARS CORONAVIRUS 2 (TAT 6-24 HRS): SARS Coronavirus 2: NEGATIVE

## 2020-09-17 ENCOUNTER — Other Ambulatory Visit: Payer: Self-pay

## 2020-09-17 ENCOUNTER — Ambulatory Visit (HOSPITAL_COMMUNITY)
Admission: EM | Admit: 2020-09-17 | Discharge: 2020-09-17 | Disposition: A | Discharge status: Home / Self Care | Payer: Medicaid Other | Attending: Emergency Medicine | Admitting: Emergency Medicine

## 2020-09-17 ENCOUNTER — Encounter (HOSPITAL_COMMUNITY): Payer: Self-pay

## 2020-09-17 DIAGNOSIS — Z87891 Personal history of nicotine dependence: Secondary | ICD-10-CM | POA: Insufficient documentation

## 2020-09-17 DIAGNOSIS — Z20822 Contact with and (suspected) exposure to covid-19: Secondary | ICD-10-CM | POA: Diagnosis not present

## 2020-09-17 DIAGNOSIS — J029 Acute pharyngitis, unspecified: Secondary | ICD-10-CM | POA: Diagnosis present

## 2020-09-17 LAB — POCT RAPID STREP A, ED / UC: Streptococcus, Group A Screen (Direct): NEGATIVE

## 2020-09-17 LAB — POC INFLUENZA A AND B ANTIGEN (URGENT CARE ONLY)
INFLUENZA A ANTIGEN, POC: NEGATIVE
INFLUENZA B ANTIGEN, POC: NEGATIVE

## 2020-09-17 MED ORDER — LIDOCAINE VISCOUS HCL 2 % MT SOLN: 15.0000 mL | OROMUCOSAL | 0 refills | Status: DC | PRN

## 2020-09-17 MED ORDER — BENZONATATE 100 MG PO CAPS: 100.0000 mg | ORAL_CAPSULE | Freq: Three times a day (TID) | ORAL | 0 refills | Status: DC

## 2020-09-17 MED ORDER — GUAIFENESIN-DM 100-10 MG/5ML PO SYRP: 5.0000 mL | ORAL_SOLUTION | ORAL | 0 refills | Status: DC | PRN

## 2020-09-17 NOTE — ED Triage Notes (Signed)
Pt c/o a sore throat, congestion x 3 days. Pt states the sxs have been the worse today. Pt states she is concerned of a bump on her leg.

## 2020-09-17 NOTE — ED Provider Notes (Signed)
[place]    CSN: [id number] Arrival date & time: 09/17/20  1351      History   Chief Complaint Chief Complaint  Patient presents with  . Sore Throat  . Nasal Congestion    HPI Terry Morgan is a 40 y.o. female.   Patient presents with dry scratchy sore throat, productive cough, post nasal drip and nasal congestion beginning 3 days ago. Able to tolerate food and liquids. Was unable to speak last night but resolved after use of cough drops. Denies headache, fever, chills, bodyaches, ear pain, sinus pressure, shortness of breath, chest pain, abdominal pain, N/V/D. Using tea for symptom management. Unvaccinated. Known sick contact.   Past Medical History:  Diagnosis Date  . Blood transfusion without reported diagnosis   . CAD (coronary artery disease)   . GERD (gastroesophageal reflux disease)   . Menorrhagia   . Nausea   . NSTEMI (non-ST elevated myocardial infarction) ([place])    a. NSTEMI 08/2014 due to acute occlusion of the RCA s/p aspiration thrombectomy and overlapping DES - EF 50-55%.  . Obesity   . Thrombocytosis 09/11/2014  . Tobacco abuse     Patient Active Problem List   Diagnosis Date Noted  . Numbness 09/08/2015  . Carpal tunnel syndrome 09/08/2015  . H. pylori infection 09/24/2014  . Obesity   . Tobacco abuse   . CAD (coronary artery disease)   . Nausea & vomiting 09/15/2014  . GERD (gastroesophageal reflux disease) 09/15/2014  . Odynophagia 09/15/2014  . Constipation 09/15/2014  . Menorrhagia 09/14/2014  . Sinus bradycardia 09/14/2014  . NSTEMI (non-ST elevated myocardial infarction) ([place]) 09/11/2014  . Chest pain 09/11/2014  . Anemia 09/11/2014  . Thrombocytosis 09/11/2014    Past Surgical History:  Procedure Laterality Date  . CARDIAC CATHETERIZATION N/A 09/12/2014   Procedure: Left Heart Cath and Coronary Angiography;  Surgeon: Sherren Mocha, MD;  Location: [place] [facility];  Service: Cardiovascular;  Laterality: N/A;  . CARDIAC  CATHETERIZATION N/A 09/12/2014   Procedure: Coronary Stent Intervention;  Surgeon: Sherren Mocha, MD;  Location: [place] [facility];  Service: Cardiovascular;  Laterality: N/A;  aspiration thrombectomy  . CESAREAN SECTION      OB History    Gravida  3   Para  1   Term  1   Preterm      AB  1   Living  1     SAB      IAB  1   Ectopic      Multiple      Live Births               Home Medications    Prior to Admission medications   Medication Sig Start Date End Date Taking? Authorizing Provider  guaiFENesin-dextromethorphan (ROBITUSSIN DM) 100-10 MG/5ML syrup Take 5 mLs by mouth every 4 (four) hours as needed for cough. 09/17/20  Yes Vali Capano R, NP  lidocaine (XYLOCAINE) 2 % solution Use as directed 15 mLs in the mouth or throat as needed for mouth pain. 09/17/20  Yes Hans Eden, NP  aspirin EC 81 MG EC tablet Take 1 tablet (81 mg total) by mouth daily. 09/16/14   Dunn, Nedra Hai, PA-C  benzonatate (TESSALON) 100 MG capsule Take 1 capsule (100 mg total) by mouth every 8 (eight) hours. 09/17/20   Vidal Lampkins, Leitha Schuller, NP  diclofenac (CATAFLAM) 50 MG tablet Take 1 tablet (50 mg total) by mouth 3 (three) times daily. One tablet TID with  food prn pain. 02/14/16   Janne Napoleon, NP  fluticasone (FLONASE) 50 MCG/ACT nasal spray Place 2 sprays into both nostrils daily. 06/23/20   Hughie Closs, PA-C    Family History Family History  Problem Relation Age of Onset  . Hypertension Mother   . Hyperlipidemia Father   . Stroke Sister 31  . Healthy Brother   . Healthy Sister   . Healthy Sister   . Healthy Brother   . Healthy Brother   . Healthy Brother   . Healthy Brother   . Asthma Brother   . Asthma Brother   . Heart disease Paternal Grandmother   . Heart murmur Cousin     Social History Social History   Tobacco Use  . Smoking status: Former Smoker    Packs/day: 0.00    Years: 12.00    Pack years: 0.00    Types: Cigarettes  . Smokeless tobacco: Never  Used  Substance Use Topics  . Alcohol use: No    Alcohol/week: 0.0 standard drinks  . Drug use: No     Allergies   Patient has no known allergies.   Review of Systems Review of Systems  Defer to HPI   Physical Exam Triage Vital Signs ED Triage Vitals  Enc Vitals Group     BP 09/17/20 1429 126/68     Pulse Rate 09/17/20 1427 87     Resp 09/17/20 1427 19     Temp 09/17/20 1428 99.7 F (37.6 C)     Temp Source 09/17/20 1428 Oral     SpO2 09/17/20 1427 99 %     Weight --      Height --      Head Circumference --      Peak Flow --      Pain Score 09/17/20 1426 10     Pain Loc --      Pain Edu? --      Excl. in [place]? --    No data found.  Updated Vital Signs BP 126/68   Pulse 87   Temp 99.7 F (37.6 C) (Oral)   Resp 19   LMP 08/28/2020 (Exact Date)   SpO2 99%   Visual Acuity Right Eye Distance:   Left Eye Distance:   Bilateral Distance:    Right Eye Near:   Left Eye Near:    Bilateral Near:     Physical Exam Constitutional:      Appearance: She is well-developed and normal weight.  HENT:     Head: Normocephalic.     Right Ear: Ear canal normal. A middle ear effusion is present.     Left Ear: Ear canal normal. A middle ear effusion is present.     Nose: Rhinorrhea present. No congestion.     Mouth/Throat:     Mouth: Mucous membranes are moist.     Pharynx: Posterior oropharyngeal erythema present.     Tonsils: No tonsillar exudate or tonsillar abscesses. 0 on the right. 0 on the left.  Eyes:     Conjunctiva/sclera: Conjunctivae normal.     Pupils: Pupils are equal, round, and reactive to light.  Cardiovascular:     Rate and Rhythm: Normal rate and regular rhythm.     Heart sounds: Normal heart sounds.  Pulmonary:     Effort: Pulmonary effort is normal.     Breath sounds: Normal breath sounds.  Musculoskeletal:     Cervical back: Normal range of motion.  Lymphadenopathy:     Cervical: Cervical  adenopathy present.  Skin:    General: Skin is warm  and dry.  Neurological:     General: No focal deficit present.     Mental Status: She is alert and oriented to person, place, and time.  Psychiatric:        Mood and Affect: Mood normal.        Behavior: Behavior normal.      UC Treatments / Results  Labs (all labs ordered are listed, but only abnormal results are displayed) Labs Reviewed  SARS CORONAVIRUS 2 (TAT 6-24 HRS)  CULTURE, GROUP A STREP ([place])  POC INFLUENZA A AND B ANTIGEN (URGENT CARE ONLY)  POCT RAPID STREP A, ED / UC    EKG   Radiology No results found.  Procedures Procedures (including critical care time)  Medications Ordered in UC Medications - No data to display  Initial Impression / Assessment and Plan / UC Course  I have reviewed the triage vital signs and the nursing notes.  Pertinent labs & imaging results that were available during my care of the patient were reviewed by me and considered in my medical decision making (see chart for details).  Sore throat  1. Rapid strep- negative 2. Flu- pending 3. covid- pending 4. Lidocaine 2% viscous 15 mL prn 5. Robitussin DM 100-10 every 4 hours prn 6. Tessalon 100 mg every 8 hours prn Final Clinical Impressions(s) / UC Diagnoses   Final diagnoses:  Sore throat     Discharge Instructions     Can use Tessalon pill every 8 hours as needed for cough  Can use Robitussin-DM 5 mL every 4 hours as needed for cough and congestion  Can use medication to raise 60mL as needed for sore throat, can purchase over-the-counter Chloraseptic (phenol) spray, can attempt salt water gargles, hard candies, throat lozenges, hot or cold liquids to preference  COVID test pending 24 hours   Flu test pending  Rapid strep test negative    ED Prescriptions    Medication Sig Dispense Auth. Provider   benzonatate (TESSALON) 100 MG capsule Take 1 capsule (100 mg total) by mouth every 8 (eight) hours. 21 capsule Jachob Mcclean R, NP   guaiFENesin-dextromethorphan  (ROBITUSSIN DM) 100-10 MG/5ML syrup Take 5 mLs by mouth every 4 (four) hours as needed for cough. 118 mL Zaniah Titterington R, NP   lidocaine (XYLOCAINE) 2 % solution Use as directed 15 mLs in the mouth or throat as needed for mouth pain. 100 mL Hans Eden, NP     PDMP not reviewed this encounter.   Hans Eden, NP 09/17/20 1545

## 2020-09-17 NOTE — Discharge Instructions (Signed)
Can use Tessalon pill every 8 hours as needed for cough  Can use Robitussin-DM 5 mL every 4 hours as needed for cough and congestion  Can use medication to raise 60mL as needed for sore throat, can purchase over-the-counter Chloraseptic (phenol) spray, can attempt salt water gargles, hard candies, throat lozenges, hot or cold liquids to preference  COVID test pending 24 hours   Flu test pending  Rapid strep test negative

## 2020-09-18 LAB — SARS CORONAVIRUS 2 (TAT 6-24 HRS): SARS Coronavirus 2: NEGATIVE

## 2020-09-18 LAB — CULTURE, GROUP A STREP (THRC)

## 2020-09-19 LAB — CULTURE, GROUP A STREP (THRC)

## 2021-02-06 ENCOUNTER — Encounter (HOSPITAL_COMMUNITY): Payer: Self-pay | Admitting: Emergency Medicine

## 2021-02-06 ENCOUNTER — Other Ambulatory Visit: Payer: Self-pay

## 2021-02-06 ENCOUNTER — Ambulatory Visit (HOSPITAL_COMMUNITY)
Admission: EM | Admit: 2021-02-06 | Discharge: 2021-02-06 | Disposition: A | Discharge status: Home / Self Care | Payer: Medicaid Other | Attending: Physician Assistant | Admitting: Physician Assistant

## 2021-02-06 DIAGNOSIS — H66002 Acute suppurative otitis media without spontaneous rupture of ear drum, left ear: Secondary | ICD-10-CM | POA: Diagnosis not present

## 2021-02-06 MED ORDER — FLUTICASONE PROPIONATE 50 MCG/ACT NA SUSP: 1.0000 | Freq: Every day | NASAL | 0 refills | Status: AC

## 2021-02-06 MED ORDER — AZITHROMYCIN 250 MG PO TABS: 250.0000 mg | ORAL_TABLET | Freq: Every day | ORAL | 0 refills | Status: DC

## 2021-02-06 NOTE — ED Triage Notes (Signed)
Pt presents with c/o left ear pain. Pt states it feels like fluid is in her ear. Reports hearing an echo when she talks and pressure. Reports recently having a cold.

## 2021-02-06 NOTE — ED Provider Notes (Signed)
[place]    CSN: [id number] Arrival date & time: 02/06/21  1721      History   Chief Complaint Chief Complaint  Patient presents with   Otalgia    HPI Terry Morgan is a 40 y.o. female.   Patient presents today with several day history of worsening left ear pain.  She reports feeling that she is underwater and a sensation of fullness.  Reports associated pain which is rated 10 on a 0-10 pain scale, localized to left ear, described as intense pressure, no alleviating factors identified.  She is having difficulty hearing and feels as though there is an echo but denies tinnitus.  She was recently treated for URI/sinus infection and completed course of doxycycline.  She denies additional antibiotic use since that time.  She denies history of recurrent ear infections and has not seen an ENT in the past.  Reports that URI symptoms including cough, congestion, fever have resolved.  Denies any recent swimming, airplane travel, recent diving.   Past Medical History:  Diagnosis Date   Blood transfusion without reported diagnosis    CAD (coronary artery disease)    GERD (gastroesophageal reflux disease)    Menorrhagia    Nausea    NSTEMI (non-ST elevated myocardial infarction) ([place])    a. NSTEMI 08/2014 due to acute occlusion of the RCA s/p aspiration thrombectomy and overlapping DES - EF 50-55%.   Obesity    Thrombocytosis 09/11/2014   Tobacco abuse     Patient Active Problem List   Diagnosis Date Noted   Numbness 09/08/2015   Carpal tunnel syndrome 09/08/2015   H. pylori infection 09/24/2014   Obesity    Tobacco abuse    CAD (coronary artery disease)    Nausea & vomiting 09/15/2014   GERD (gastroesophageal reflux disease) 09/15/2014   Odynophagia 09/15/2014   Constipation 09/15/2014   Menorrhagia 09/14/2014   Sinus bradycardia 09/14/2014   NSTEMI (non-ST elevated myocardial infarction) ([place]) 09/11/2014   Chest pain 09/11/2014   Anemia 09/11/2014    Thrombocytosis 09/11/2014    Past Surgical History:  Procedure Laterality Date   CARDIAC CATHETERIZATION N/A 09/12/2014   Procedure: Left Heart Cath and Coronary Angiography;  Surgeon: Sherren Mocha, MD;  Location: [facility];  Service: Cardiovascular;  Laterality: N/A;   CARDIAC CATHETERIZATION N/A 09/12/2014   Procedure: Coronary Stent Intervention;  Surgeon: Sherren Mocha, MD;  Location: [facility];  Service: Cardiovascular;  Laterality: N/A;  aspiration thrombectomy   CESAREAN SECTION      OB History     Gravida  3   Para  1   Term  1   Preterm      AB  1   Living  1      SAB      IAB  1   Ectopic      Multiple      Live Births               Home Medications    Prior to Admission medications   Medication Sig Start Date End Date Taking? Authorizing Provider  azithromycin (ZITHROMAX) 250 MG tablet Take 1 tablet (250 mg total) by mouth daily. Take first 2 tablets together, then 1 every day until finished. 02/06/21  Yes Catelin Manthe K, PA-C  fluticasone (FLONASE) 50 MCG/ACT nasal spray Place 1 spray into both nostrils daily. 02/06/21  Yes Yulia Ulrich, Derry Skill, PA-C  aspirin EC 81 MG EC tablet Take 1 tablet (81 mg  total) by mouth daily. 09/16/14   Dunn, Nedra Hai, PA-C  benzonatate (TESSALON) 100 MG capsule Take 1 capsule (100 mg total) by mouth every 8 (eight) hours. 09/17/20   White, Leitha Schuller, NP  diclofenac (CATAFLAM) 50 MG tablet Take 1 tablet (50 mg total) by mouth 3 (three) times daily. One tablet TID with food prn pain. 02/14/16   Janne Napoleon, NP  guaiFENesin-dextromethorphan (ROBITUSSIN DM) 100-10 MG/5ML syrup Take 5 mLs by mouth every 4 (four) hours as needed for cough. 09/17/20   White, Leitha Schuller, NP  lidocaine (XYLOCAINE) 2 % solution Use as directed 15 mLs in the mouth or throat as needed for mouth pain. 09/17/20   Hans Eden, NP    Family History Family History  Problem Relation Age of Onset   Hypertension Mother    Hyperlipidemia  Father    Stroke Sister 72   Healthy Brother    Healthy Sister    Healthy Sister    Healthy Brother    Healthy Brother    Healthy Brother    Healthy Brother    Asthma Brother    Asthma Brother    Heart disease Paternal Grandmother    Heart murmur Cousin     Social History Social History   Tobacco Use   Smoking status: Former    Packs/day: 0.00    Years: 12.00    Pack years: 0.00    Types: Cigarettes   Smokeless tobacco: Never  Substance Use Topics   Alcohol use: No    Alcohol/week: 0.0 standard drinks   Drug use: No     Allergies   Patient has no known allergies.   Review of Systems Review of Systems  Constitutional:  Negative for activity change, appetite change, fatigue and fever.  HENT:  Positive for ear pain and hearing loss. Negative for congestion, ear discharge, sinus pressure, sneezing and sore throat.   Respiratory:  Negative for cough and shortness of breath.   Cardiovascular:  Negative for chest pain.  Gastrointestinal:  Negative for abdominal pain, diarrhea, nausea and vomiting.  Neurological:  Negative for dizziness, light-headedness and headaches.    Physical Exam Triage Vital Signs ED Triage Vitals  Enc Vitals Group     BP 02/06/21 1815 (!) 156/94     Pulse Rate 02/06/21 1815 65     Resp 02/06/21 1815 16     Temp 02/06/21 1815 98.3 F (36.8 C)     Temp Source 02/06/21 1815 Oral     SpO2 02/06/21 1815 98 %     Weight --      Height --      Head Circumference --      Peak Flow --      Pain Score 02/06/21 1816 10     Pain Loc --      Pain Edu? --      Excl. in [place]? --    No data found.  Updated Vital Signs BP (!) 156/94   Pulse 65   Temp 98.3 F (36.8 C) (Oral)   Resp 16   LMP 01/18/2021 (Exact Date)   SpO2 98%   Breastfeeding Unknown   Visual Acuity Right Eye Distance:   Left Eye Distance:   Bilateral Distance:    Right Eye Near:   Left Eye Near:    Bilateral Near:     Physical Exam Vitals reviewed.  Constitutional:       General: She is awake. She is not in acute distress.  Appearance: Normal appearance. She is well-developed. She is not ill-appearing.     Comments: Very pleasant female appears stated age no acute distress sitting comfortably in exam room  HENT:     Head: Normocephalic and atraumatic.     Right Ear: Tympanic membrane, ear canal and external ear normal. Tympanic membrane is not erythematous or bulging.     Left Ear: Ear canal and external ear normal. Tympanic membrane is erythematous and bulging.     Nose:     Right Sinus: No maxillary sinus tenderness or frontal sinus tenderness.     Left Sinus: No maxillary sinus tenderness or frontal sinus tenderness.     Mouth/Throat:     Pharynx: Uvula midline. No oropharyngeal exudate or posterior oropharyngeal erythema.  Cardiovascular:     Rate and Rhythm: Normal rate and regular rhythm.     Heart sounds: Normal heart sounds, S1 normal and S2 normal. No murmur heard. Pulmonary:     Effort: Pulmonary effort is normal.     Breath sounds: Normal breath sounds. No wheezing, rhonchi or rales.     Comments: Clear to auscultation bilaterally Psychiatric:        Behavior: Behavior is cooperative.     UC Treatments / Results  Labs (all labs ordered are listed, but only abnormal results are displayed) Labs Reviewed - No data to display  EKG   Radiology No results found.  Procedures Procedures (including critical care time)  Medications Ordered in UC Medications - No data to display  Initial Impression / Assessment and Plan / UC Course  I have reviewed the triage vital signs and the nursing notes.  Pertinent labs & imaging results that were available during my care of the patient were reviewed by me and considered in my medical decision making (see chart for details).     Otitis media identified on physical exam.  Discussed potential utility of starting different antibiotic such as amoxicillin.  Patient believes that she has a  history of allergy to amoxicillin but this is not listed in her chart or in care everywhere.  To be safe, we will start azithromycin she does not member the reaction to amoxicillin.  Recommended that she try to confirm this information so this can be added to her medical chart in the future.  Recommended that she use Flonase to help manage symptoms no surrounding new prescription.  Discussed that if she has recurrent symptoms or symptoms not improved she may need to see ENT.  Discussed alarm symptoms that warrant emergent evaluation.  Strict return precautions given to which she expressed understanding.  Final Clinical Impressions(s) / UC Diagnoses   Final diagnoses:  Non-recurrent acute suppurative otitis media of left ear without spontaneous rupture of tympanic membrane     Discharge Instructions      We are treating you for an ear infection.  Please take azithromycin as prescribed.  I would also like you to take Flonase 1 spray in each nostril daily for at least the next several weeks as this will help with some of your symptoms.  You can alternate Tylenol ibuprofen for pain.  If you have any worsening symptoms you need to be reevaluated.     ED Prescriptions     Medication Sig Dispense Auth. Provider   azithromycin (ZITHROMAX) 250 MG tablet Take 1 tablet (250 mg total) by mouth daily. Take first 2 tablets together, then 1 every day until finished. 6 tablet Dymin Dingledine K, PA-C   fluticasone (FLONASE) 50  MCG/ACT nasal spray Place 1 spray into both nostrils daily. 16 g Miryam Mcelhinney K, PA-C      PDMP not reviewed this encounter.   Terrilee Croak, PA-C 02/06/21 1850

## 2021-02-06 NOTE — Discharge Instructions (Signed)
We are treating you for an ear infection.  Please take azithromycin as prescribed.  I would also like you to take Flonase 1 spray in each nostril daily for at least the next several weeks as this will help with some of your symptoms.  You can alternate Tylenol ibuprofen for pain.  If you have any worsening symptoms you need to be reevaluated.

## 2023-03-30 ENCOUNTER — Emergency Department (HOSPITAL_COMMUNITY): Payer: Medicaid Other

## 2023-03-30 ENCOUNTER — Other Ambulatory Visit: Payer: Self-pay

## 2023-03-30 ENCOUNTER — Emergency Department (HOSPITAL_COMMUNITY)
Admission: EM | Admit: 2023-03-30 | Discharge: 2023-03-30 | Discharge status: Against Medical Advice | Payer: Medicaid Other | Attending: Emergency Medicine | Admitting: Emergency Medicine

## 2023-03-30 DIAGNOSIS — I2510 Atherosclerotic heart disease of native coronary artery without angina pectoris: Secondary | ICD-10-CM | POA: Diagnosis not present

## 2023-03-30 DIAGNOSIS — Z853 Personal history of malignant neoplasm of breast: Secondary | ICD-10-CM | POA: Insufficient documentation

## 2023-03-30 DIAGNOSIS — Z7982 Long term (current) use of aspirin: Secondary | ICD-10-CM | POA: Insufficient documentation

## 2023-03-30 DIAGNOSIS — Z5329 Procedure and treatment not carried out because of patient's decision for other reasons: Secondary | ICD-10-CM | POA: Diagnosis not present

## 2023-03-30 DIAGNOSIS — S60211A Contusion of right wrist, initial encounter: Secondary | ICD-10-CM

## 2023-03-30 DIAGNOSIS — L7632 Postprocedural hematoma of skin and subcutaneous tissue following other procedure: Secondary | ICD-10-CM | POA: Diagnosis not present

## 2023-03-30 DIAGNOSIS — M25431 Effusion, right wrist: Secondary | ICD-10-CM | POA: Insufficient documentation

## 2023-03-30 MED ORDER — ACETAMINOPHEN 325 MG PO TABS
650.0000 mg | ORAL_TABLET | Freq: Once | ORAL | Status: AC
  Administered 2023-03-30: 650 mg via ORAL
  Filled 2023-03-30: qty 2

## 2023-03-30 NOTE — Discharge Instructions (Signed)
You are seen in the ER for swelling TRS.  Unfortunately we do not have ultrasound at this time.  You did not want a CT scan or just a to get an ultrasound.  Make sure you come back tomorrow to get an ultrasound.  Unfortunately we are not able to order an ultrasound so you need to have this done in the ER unless you set this up with your cardiologist.  Come back sooner if you have new or worsening symptoms or change your mind.

## 2023-03-30 NOTE — ED Provider Notes (Signed)
[place] EMERGENCY DEPARTMENT AT [facility] Provider Note   CSN: [id number] Arrival date & time: 03/30/23  2003     History  Chief Complaint  Patient presents with   Right wrist Pain/Swelling    S/P Cardiac Cath Procedure    Terry Morgan is a 42 y.o. female.  She has PMH of breast cancer, CAD status post 1 stent over 2 years ago.  Presents ER today for right wrist pain and swelling.  She had cardiac catheterization today at [facility] due to abnormal stress test as part of her cardiac clearance for surgery for upcoming mastectomy.  She has had increasing swelling and pain since going home.  She states with prior catheterizations in the past she is not having some of pain or swelling.  Denies any numbness tingling or weakness in her hands, no color change to her hand or fingers. HPI     Home Medications Prior to Admission medications   Medication Sig Start Date End Date Taking? Authorizing Provider  aspirin EC 81 MG EC tablet Take 1 tablet (81 mg total) by mouth daily. 09/16/14   Dunn, Tacey Ruiz, PA-C  azithromycin (ZITHROMAX) 250 MG tablet Take 1 tablet (250 mg total) by mouth daily. Take first 2 tablets together, then 1 every day until finished. 02/06/21   Raspet, Noberto Retort, PA-C  benzonatate (TESSALON) 100 MG capsule Take 1 capsule (100 mg total) by mouth every 8 (eight) hours. 09/17/20   White, Elita Boone, NP  diclofenac (CATAFLAM) 50 MG tablet Take 1 tablet (50 mg total) by mouth 3 (three) times daily. One tablet TID with food prn pain. 02/14/16   Hayden Rasmussen, NP  fluticasone (FLONASE) 50 MCG/ACT nasal spray Place 1 spray into both nostrils daily. 02/06/21   Raspet, Erin K, PA-C  guaiFENesin-dextromethorphan (ROBITUSSIN DM) 100-10 MG/5ML syrup Take 5 mLs by mouth every 4 (four) hours as needed for cough. 09/17/20   White, Elita Boone, NP  lidocaine (XYLOCAINE) 2 % solution Use as directed 15 mLs in the mouth or throat as needed for mouth pain. 09/17/20   Valinda Hoar, NP       Allergies    Patient has no known allergies.    Review of Systems   Review of Systems  Physical Exam Updated Vital Signs BP (!) 158/97   Pulse 68   Temp 98.7 F (37.1 C) (Oral)   Resp 18   SpO2 99%  Physical Exam Vitals and nursing note reviewed.  Constitutional:      General: She is not in acute distress.    Appearance: She is well-developed.  HENT:     Head: Normocephalic and atraumatic.  Eyes:     Conjunctiva/sclera: Conjunctivae normal.  Cardiovascular:     Rate and Rhythm: Normal rate and regular rhythm.     Heart sounds: No murmur heard. Pulmonary:     Effort: Pulmonary effort is normal. No respiratory distress.     Breath sounds: Normal breath sounds.  Abdominal:     Palpations: Abdomen is soft.     Tenderness: There is no abdominal tenderness.  Musculoskeletal:        General: Normal range of motion.     Cervical back: Neck supple.     Comments: Swelling noted to the right wrist diffusely mild swelling of the right hand.  Hand is warm and well-perfused with a bounding right radial pulse.  Normal sensation throughout the right upper extremity to light touch.  Patient can make okay sign,  thumbs up and cross her fingers.  Skin:    General: Skin is warm and dry.     Capillary Refill: Capillary refill takes less than 2 seconds.     Comments: Right ulnar puncture site is not bleeding, no overlying thrill.  There is overlying soft tissue swelling  Neurological:     General: No focal deficit present.     Mental Status: She is alert and oriented to person, place, and time.  Psychiatric:        Mood and Affect: Mood normal.     ED Results / Procedures / Treatments   Labs (all labs ordered are listed, but only abnormal results are displayed) Labs Reviewed - No data to display  EKG None  Radiology DG Wrist Complete Right  Result Date: 03/30/2023 CLINICAL DATA:  Wrist swelling following recent cardiac catheterization EXAM: RIGHT WRIST - COMPLETE 3+ VIEW  COMPARISON:  None Available. FINDINGS: No acute bony abnormality is noted. No radiopaque foreign body is seen. Mild soft tissue swelling is noted in the distal forearm medially consistent with the recent cardiac catheterization. No discrete mass to suggest hematoma is noted. Correlate with physical exam. IMPRESSION: Soft tissue swelling without discrete hematoma. Correlate with physical exam. Electronically Signed   By: Alcide Clever M.D.   On: 03/30/2023 21:18    Procedures Procedures    Medications Ordered in ED Medications  acetaminophen (TYLENOL) tablet 650 mg (650 mg Oral Given 03/30/23 2206)    ED Course/ Medical Decision Making/ A&P                                 Medical Decision Making This patient presents to the ED for concern of swelling to the right wrist after ulnar puncture for right heart cath, this involves an extensive number of treatment options, and is a complaint that carries with it a high risk of complications and morbidity.  The differential diagnosis includes hematoma, pseudoaneurysm, aneurysm, other   Co morbidities that complicate the patient evaluation :   Breast cancer, CAD   Additional history obtained:  Additional history obtained from EMR External records from outside source obtained and reviewed including catheterization notes from today at [facility]    Imaging Studies ordered:  I ordered imaging studies including x-ray left wrist which shows soft tissue swelling I independently visualized and interpreted imaging within scope of identifying emergent findings  I agree with the radiologist interpretation    Consultations Obtained:  I requested consultation with the cardiology fellow Dr. Granville Lewis,  and discussed lab and imaging findings as well as pertinent plan -he evaluated the patient in the ED.  Offered CTA which patient refused disc not wanting an additional dye load, offered admission or stay in the ED for ultrasound in the morning when available  and patient declined this as well.  Recommend she needs imaging, can have ultrasound tomorrow.  If she develops worsening symptoms she should come back to the ER right away   Problem List / ED Course / Critical interventions / Medication management  Right wrist swelling-patient having swelling after cardiac cath today at puncture site, she did have a hematoma after procedure and they applied TR band, bleeding resolved, patient had soreness, sent home but noticed increasing swelling when she went home over the next several hours so ultimately presented to the ER.  She states over the past couple hours it has not gotten any worse though it had  been previously worsening gradually.  She is not on anticoagulants.  Denies injury or trauma.  Unfortunately we do not have vascular ultrasound available at this time.  Patient seen by cardiology fellow and she declined options for waiting for ultrasound the morning as well as CTA.  She was informed that she is imaging, she is going to come back to the ER in the morning for the ultrasound.  She was informed that if she has worsening swelling or color changes or numbness or tingling she is to come back right away.  I ordered medication including Tylenol for pain Reevaluation of the patient after these medicines showed that the patient improved I have reviewed the patients home medicines and have made adjustments as needed       Amount and/or Complexity of Data Reviewed Radiology: ordered.  Risk OTC drugs.           Final Clinical Impression(s) / ED Diagnoses Final diagnoses:  Wrist swelling, right    Rx / DC Orders ED Discharge Orders     None         Ma Rings, PA-C 03/31/23 0014    Laurence Spates, MD 03/31/23 [phone]

## 2023-03-30 NOTE — ED Triage Notes (Signed)
Patient reports swelling/pain at right wrist this afternoon , she had a cardiac cath at [facility] this morning . Patient stated multiple puncture attempts at right wrist before vascular access .

## 2023-03-30 NOTE — Consult Note (Signed)
Cardiology Consultation   Patient ID: Terry Morgan MRN: [id number]; DOB: 24-Dec-1980  Admit date: 03/30/2023 Date of Consult: 03/30/2023  PCP:  [facility] Providers Cardiologist:  None     Patient Profile:   Terry Morgan is a 42 y.o. female with a hx of stage IIA right invasive ductal carcinoma and CAD s/p PCI to RCA who is being seen 03/30/2023 for the evaluation of right wrist swelling following cath at [facility] on 12/11 at the request of the ED team.  History of Present Illness:   Terry Morgan underwent LHC at [facility] today (03/30/2023) following an abnormal stress test that was done as part of pre-op evaluation (prior to mastectomy). Access was right radial. LHC showed 40% ISR of pRCA, and no intervention was performed. Patient reports that she started to have right wrist pain immediately following the procedure; following TR band removal, she was discharged home, and she reports that she noticed increased in the swelling in the first hours after she got home with increased pain. Swelling and pain have been relatively stable in the past couple of hours as per the patient. No bleeding.  Past Medical History:  Diagnosis Date   Blood transfusion without reported diagnosis    CAD (coronary artery disease)    GERD (gastroesophageal reflux disease)    Menorrhagia    Nausea    NSTEMI (non-ST elevated myocardial infarction) (HCC)    a. NSTEMI 08/2014 due to acute occlusion of the RCA s/p aspiration thrombectomy and overlapping DES - EF 50-55%.   Obesity    Thrombocytosis 09/11/2014   Tobacco abuse     Past Surgical History:  Procedure Laterality Date   CARDIAC CATHETERIZATION N/A 09/12/2014   Procedure: Left Heart Cath and Coronary Angiography;  Surgeon: Terry Bollman, MD;  Location: [facility] INVASIVE CV LAB;  Service: Cardiovascular;  Laterality: N/A;   CARDIAC CATHETERIZATION N/A 09/12/2014   Procedure: Coronary Stent  Intervention;  Surgeon: Terry Bollman, MD;  Location: [facility] INVASIVE CV LAB;  Service: Cardiovascular;  Laterality: N/A;  aspiration thrombectomy   CESAREAN SECTION       Home Medications:  Prior to Admission medications   Medication Sig Start Date End Date Taking? Authorizing Provider  aspirin EC 81 MG EC tablet Take 1 tablet (81 mg total) by mouth daily. 09/16/14   Morgan, Terry Ruiz, PA-C  azithromycin (ZITHROMAX) 250 MG tablet Take 1 tablet (250 mg total) by mouth daily. Take first 2 tablets together, then 1 every day until finished. 02/06/21   Morgan, Terry Retort, PA-C  benzonatate (TESSALON) 100 MG capsule Take 1 capsule (100 mg total) by mouth every 8 (eight) hours. 09/17/20   Morgan, Terry Boone, NP  diclofenac (CATAFLAM) 50 MG tablet Take 1 tablet (50 mg total) by mouth 3 (three) times daily. One tablet TID with food prn pain. 02/14/16   Terry Rasmussen, NP  fluticasone (FLONASE) 50 MCG/ACT nasal spray Place 1 spray into both nostrils daily. 02/06/21   Morgan, Terry K, PA-C  guaiFENesin-dextromethorphan (ROBITUSSIN DM) 100-10 MG/5ML syrup Take 5 mLs by mouth every 4 (four) hours as needed for cough. 09/17/20   Morgan, Terry Boone, NP  lidocaine (XYLOCAINE) 2 % solution Use as directed 15 mLs in the mouth or throat as needed for mouth pain. 09/17/20   Terry Hoar, NP    Inpatient Medications: Scheduled Meds:  Continuous Infusions:  PRN Meds:   Allergies:   No Known Allergies  Social History:  Social History   Socioeconomic History   Marital status: Single    Spouse name: Not on file   Number of children: Not on file   Years of education: Not on file   Highest education level: Not on file  Occupational History   Not on file  Tobacco Use   Smoking status: Former    Current packs/day: 0.00    Types: Cigarettes   Smokeless tobacco: Never  Substance and Sexual Activity   Alcohol use: No    Alcohol/week: 0.0 standard drinks of alcohol   Drug use: No   Sexual activity: Not Currently  Other  Topics Concern   Not on file  Social History Narrative   Not on file   Social Determinants of Health   Financial Resource Strain: High Risk (10/27/2022)   Received from [facility]   Overall Financial Resource Strain (CARDIA)    Difficulty of Paying Living Expenses: Hard  Food Insecurity: Food Insecurity Present (10/27/2022)   Received from [facility]   Hunger Vital Sign    Worried About Running Out of Food in the Last Year: Sometimes true    Ran Out of Food in the Last Year: Sometimes true  Transportation Needs: No Transportation Needs (10/27/2022)   Received from [facility] - Transportation    Lack of Transportation (Medical): No    Lack of Transportation (Non-Medical): No  Physical Activity: Insufficiently Active (10/27/2022)   Received from [facility]   Exercise Vital Sign    Days of Exercise per Week: 2 days    Minutes of Exercise per Session: 20 min  Stress: No Stress Concern Present (10/27/2022)   Received from [facility] - Occupational Stress Questionnaire    Feeling of Stress : Not at all  Social Connections: Socially Integrated (10/27/2022)   Received from [facility]   Social Network    How would you rate your social network (family, work, friends)?: Good participation with social networks  Intimate Partner Violence: Not At Risk (03/30/2023)   Received from [facility]   HITS    Over the last 12 months how often did your partner physically hurt you?: Never    Over the last 12 months how often did your partner insult you or talk down to you?: Never    Over the last 12 months how often did your partner threaten you with physical harm?: Never    Over the last 12 months how often did your partner scream or curse at you?: Never    Family History:    Family History  Problem Relation Age of Onset   Hypertension Mother    Hyperlipidemia Father    Stroke Sister 69   Healthy Brother    Healthy Sister     Healthy Sister    Healthy Brother    Healthy Brother    Healthy Brother    Healthy Brother    Asthma Brother    Asthma Brother    Heart disease Paternal Grandmother    Heart murmur Cousin      ROS:  Please see the history of present illness.  All other ROS reviewed and negative.     Physical Exam/Data:   Vitals:   03/30/23 2031 03/30/23 2245  BP: (!) 159/103 (!) 158/97  Pulse: 70 68  Resp: 20 18  Temp: 98.7 F (37.1 C)   TempSrc: Oral   SpO2: 100% 99%   No intake or output data in  the 24 hours ending 03/30/23 2301    09/08/2015    9:52 AM 07/19/2015    1:21 AM 02/12/2015    9:19 AM  Last 3 Weights  Weight (lbs) 235 lb 231 lb 233 lb  Weight (kg) 106.595 kg 104.781 kg 105.688 kg     There is no height or weight on file to calculate BMI.  General:  Well nourished, well developed, in no acute distress HEENT: normal Neck: no JVD Vascular: Distal pulses 2+ bilaterally Cardiac:  normal S1, S2; RRR; no murmur Lungs:  clear to auscultation bilaterally, no wheezing, rhonchi or rales  Abd: soft, nontender Ext: no edema Musculoskeletal:  swelling of the right wrist area with mild tenderness Skin: warm and dry  Neuro:  CNs 2-12 intact, no focal abnormalities noted Psych:  Normal affect   EKG:  no EKG this visit   Laboratory Data:  Cr 0.6 on 03/30/2023  Radiology/Studies:  DG Wrist Complete Right  Result Date: 03/30/2023 CLINICAL DATA:  Wrist swelling following recent cardiac catheterization EXAM: RIGHT WRIST - COMPLETE 3+ VIEW COMPARISON:  None Available. FINDINGS: No acute bony abnormality is noted. No radiopaque foreign body is seen. Mild soft tissue swelling is noted in the distal forearm medially consistent with the recent cardiac catheterization. No discrete mass to suggest hematoma is noted. Correlate with physical exam. IMPRESSION: Soft tissue swelling without discrete hematoma. Correlate with physical exam. Electronically Signed   By: Alcide Clever M.D.   On:  03/30/2023 21:18     Assessment and Plan:   Right wrist hematoma  Presentation is consistent with hematoma following a right radial access at OSH for cath. It appears that the hematoma has been relatively stable for the past 2 hours as per the patient, and there are no signs or features of neurovascular compromise or compartment syndrome. Discussed with the patient that ideally we would observe overnight to make sure no active enlargement of the hematoma and to obtain ultrasound in the morning to rule out other vascular complications, such as pseudoaneurysm. However, she does not want to stay overnight and would like to present for outpatient ultrasound during the day. I discussed with the patient that if she were to have worsening of the swelling or pain, she would present to the ED.  Risk Assessment/Risk Scores:          [facility] will sign off.   Medication Recommendations:  pain management as per the ED Other recommendations (labs, testing, etc):  vascular ultrasound  Follow up as an outpatient:  none needed with [facility] cardiology; continue to follow with her cardiologist   For questions or updates, please contact [facility] Please consult [email or web address] for contact info under    Signed, Sharyn Creamer, MD  03/30/2023 11:01 PM

## 2023-04-01 ENCOUNTER — Other Ambulatory Visit (HOSPITAL_COMMUNITY): Payer: Self-pay

## 2023-04-01 ENCOUNTER — Emergency Department (HOSPITAL_COMMUNITY)
Admission: EM | Admit: 2023-04-01 | Discharge: 2023-04-01 | Disposition: A | Discharge status: Home / Self Care | Payer: Medicaid Other | Attending: Emergency Medicine | Admitting: Emergency Medicine

## 2023-04-01 ENCOUNTER — Telehealth (HOSPITAL_COMMUNITY): Payer: Self-pay | Admitting: Pharmacy Technician

## 2023-04-01 ENCOUNTER — Emergency Department (HOSPITAL_BASED_OUTPATIENT_CLINIC_OR_DEPARTMENT_OTHER): Payer: Medicaid Other

## 2023-04-01 DIAGNOSIS — Z853 Personal history of malignant neoplasm of breast: Secondary | ICD-10-CM | POA: Insufficient documentation

## 2023-04-01 DIAGNOSIS — M7989 Other specified soft tissue disorders: Secondary | ICD-10-CM

## 2023-04-01 DIAGNOSIS — I82621 Acute embolism and thrombosis of deep veins of right upper extremity: Secondary | ICD-10-CM | POA: Diagnosis not present

## 2023-04-01 DIAGNOSIS — Z7901 Long term (current) use of anticoagulants: Secondary | ICD-10-CM | POA: Insufficient documentation

## 2023-04-01 DIAGNOSIS — R2231 Localized swelling, mass and lump, right upper limb: Secondary | ICD-10-CM | POA: Diagnosis present

## 2023-04-01 DIAGNOSIS — I2510 Atherosclerotic heart disease of native coronary artery without angina pectoris: Secondary | ICD-10-CM | POA: Insufficient documentation

## 2023-04-01 LAB — CBC WITH DIFFERENTIAL/PLATELET
Abs Immature Granulocytes: 0.01 10*3/uL (ref 0.00–0.07)
Basophils Absolute: 0 10*3/uL (ref 0.0–0.1)
Basophils Relative: 1 %
Eosinophils Absolute: 0.2 10*3/uL (ref 0.0–0.5)
Eosinophils Relative: 3 %
HCT: 41 % (ref 36.0–46.0)
Hemoglobin: 13.1 g/dL (ref 12.0–15.0)
Immature Granulocytes: 0 %
Lymphocytes Relative: 35 %
Lymphs Abs: 1.5 10*3/uL (ref 0.7–4.0)
MCH: 26.7 pg (ref 26.0–34.0)
MCHC: 32 g/dL (ref 30.0–36.0)
MCV: 83.5 fL (ref 80.0–100.0)
Monocytes Absolute: 0.4 10*3/uL (ref 0.1–1.0)
Monocytes Relative: 8 %
Neutro Abs: 2.3 10*3/uL (ref 1.7–7.7)
Neutrophils Relative %: 53 %
Platelets: 199 10*3/uL (ref 150–400)
RBC: 4.91 MIL/uL (ref 3.87–5.11)
RDW: 15.9 % — ABNORMAL HIGH (ref 11.5–15.5)
WBC: 4.4 10*3/uL (ref 4.0–10.5)
nRBC: 0 % (ref 0.0–0.2)

## 2023-04-01 LAB — BASIC METABOLIC PANEL
Anion gap: 6 (ref 5–15)
BUN: 9 mg/dL (ref 6–20)
CO2: 25 mmol/L (ref 22–32)
Calcium: 8.7 mg/dL — ABNORMAL LOW (ref 8.9–10.3)
Chloride: 102 mmol/L (ref 98–111)
Creatinine, Ser: 0.61 mg/dL (ref 0.44–1.00)
GFR, Estimated: >60 mL/min (ref >60)
Glucose, Bld: 267 mg/dL — ABNORMAL HIGH (ref 70–99)
Potassium: 3.6 mmol/L (ref 3.5–5.1)
Sodium: 133 mmol/L — ABNORMAL LOW (ref 135–145)

## 2023-04-01 MED ORDER — RIVAROXABAN 15 MG PO TABS: 15.0000 mg | ORAL_TABLET | Freq: Once | ORAL | Status: DC

## 2023-04-01 MED ORDER — RIVAROXABAN (XARELTO) VTE STARTER PACK (15 & 20 MG)
ORAL_TABLET | ORAL | 0 refills | Status: AC
  Filled 2023-04-01: qty 51, 30d supply, fill #0
  Filled 2023-04-01: qty 51, 82d supply, fill #0
  Filled 2023-04-01: qty 51, 30d supply, fill #0

## 2023-04-01 MED ORDER — RIVAROXABAN (XARELTO) EDUCATION KIT FOR DVT/PE PATIENTS
PACK | Freq: Once | Status: DC
  Filled 2023-04-01: qty 1

## 2023-04-01 MED ORDER — ACETAMINOPHEN 325 MG PO TABS
650.0000 mg | ORAL_TABLET | Freq: Once | ORAL | Status: DC
  Filled 2023-04-01: qty 2

## 2023-04-01 MED ORDER — RIVAROXABAN (XARELTO) VTE STARTER PACK (15 & 20 MG)
ORAL_TABLET | ORAL | 0 refills | Status: DC
Start: 2023-04-01 — End: 2023-04-01

## 2023-04-01 NOTE — Telephone Encounter (Signed)
Patient Product/process development scientist completed.    The patient is insured through [facility].     Ran test claim for Xarelto 20 mg and the current 30 day co-pay is $4.00.   This test claim was processed through [facility]- copay amounts may vary at other pharmacies due to pharmacy/plan contracts, or as the patient moves through the different stages of their insurance plan.     Roland Earl, CPHT Pharmacy Technician III Certified Patient Advocate [facility] Patient Advocate Team Direct Number: [phone]  Fax: [phone]

## 2023-04-01 NOTE — ED Provider Notes (Signed)
[place] EMERGENCY DEPARTMENT AT [facility] Provider Note   CSN: [id number] Arrival date & time: 04/01/23  4098     History {Add pertinent medical, surgical, social history, OB history to HPI:1} Chief Complaint  Patient presents with   Arm Pain    Terry Morgan is a 42 y.o. female. Concerned for right forearm swelling   Arm Pain       Home Medications Prior to Admission medications   Medication Sig Start Date End Date Taking? Authorizing Provider  aspirin EC 81 MG EC tablet Take 1 tablet (81 mg total) by mouth daily. 09/16/14   Dunn, Tacey Ruiz, PA-C  azithromycin (ZITHROMAX) 250 MG tablet Take 1 tablet (250 mg total) by mouth daily. Take first 2 tablets together, then 1 every day until finished. 02/06/21   Raspet, Noberto Retort, PA-C  benzonatate (TESSALON) 100 MG capsule Take 1 capsule (100 mg total) by mouth every 8 (eight) hours. 09/17/20   White, Elita Boone, NP  diclofenac (CATAFLAM) 50 MG tablet Take 1 tablet (50 mg total) by mouth 3 (three) times daily. One tablet TID with food prn pain. 02/14/16   Hayden Rasmussen, NP  fluticasone (FLONASE) 50 MCG/ACT nasal spray Place 1 spray into both nostrils daily. 02/06/21   Raspet, Erin K, PA-C  guaiFENesin-dextromethorphan (ROBITUSSIN DM) 100-10 MG/5ML syrup Take 5 mLs by mouth every 4 (four) hours as needed for cough. 09/17/20   White, Elita Boone, NP  lidocaine (XYLOCAINE) 2 % solution Use as directed 15 mLs in the mouth or throat as needed for mouth pain. 09/17/20   Valinda Hoar, NP      Allergies    Patient has no known allergies.    Review of Systems   Review of Systems  Physical Exam Updated Vital Signs BP (!) 154/99   Pulse 61   Temp 98.5 F (36.9 C) (Oral)   Resp 18   Ht 5\' 1"  (1.549 m)   Wt 108.9 kg   SpO2 100%   BMI 45.35 kg/m  Physical Exam Vitals and nursing note reviewed.  Constitutional:      General: She is not in acute distress.    Appearance: She is not ill-appearing or toxic-appearing.  HENT:      Head: Normocephalic and atraumatic.     Mouth/Throat:     Mouth: Mucous membranes are moist.  Eyes:     General: No scleral icterus.       Right eye: No discharge.        Left eye: No discharge.     Conjunctiva/sclera: Conjunctivae normal.  Cardiovascular:     Rate and Rhythm: Normal rate and regular rhythm.     Pulses: Normal pulses.     Heart sounds: Normal heart sounds. No murmur heard. Pulmonary:     Effort: Pulmonary effort is normal. No respiratory distress.     Breath sounds: Normal breath sounds. No wheezing, rhonchi or rales.  Abdominal:     General: Abdomen is flat.  Musculoskeletal:     Right lower leg: No edema.     Left lower leg: No edema.  Skin:    General: Skin is warm and dry.     Findings: No rash.  Neurological:     General: No focal deficit present.     Mental Status: She is alert. Mental status is at baseline.     Comments: GCS 15. Speech is goal oriented. No deficits appreciated to CN III-XII; symmetric eyebrow raise, no facial drooping, tongue  midline. Patient has equal grip strength bilaterally with 5/5 strength against resistance in all major muscle groups bilaterally. Sensation to light touch intact. Patient moves extremities without ataxia. Patient ambulatory with steady gait.   Psychiatric:        Mood and Affect: Mood normal.        Behavior: Behavior normal.     ED Results / Procedures / Treatments   Labs (all labs ordered are listed, but only abnormal results are displayed) Labs Reviewed  CBC WITH DIFFERENTIAL/PLATELET - Abnormal; Notable for the following components:      Result Value   RDW 15.9 (*)    All other components within normal limits  BASIC METABOLIC PANEL - Abnormal; Notable for the following components:   Sodium 133 (*)    Glucose, Bld 267 (*)    Calcium 8.7 (*)    All other components within normal limits    EKG None  Radiology UE VENOUS DUPLEX (7am - 7pm) Result Date: 04/01/2023 UPPER VENOUS STUDY  Patient Name:   Terry Morgan  Date of Exam:   04/01/2023 Medical Rec #: [id number]         Accession #:    [id number] Date of Birth: 1980/08/23         Patient Gender: F Patient Age:   61 years Exam Location:  [facility] Procedure:      VAS [place] UPPER EXTREMITY VENOUS DUPLEX Referring Phys: [facility] Sevastian Witczak --------------------------------------------------------------------------------  Indications: Pain, Swelling, Edema, Erythema, and Recent cath procedure. Comparison Study: No prior exam. Performing Technologist: Fernande Bras  Examination Guidelines: A complete evaluation includes B-mode imaging, spectral Doppler, color Doppler, and power Doppler as needed of all accessible portions of each vessel. Bilateral testing is considered an integral part of a complete examination. Limited examinations for reoccurring indications may be performed as noted.  Right Findings: +----------+------------+---------+-----------+----------+-------+ RIGHT     CompressiblePhasicitySpontaneousPropertiesSummary +----------+------------+---------+-----------+----------+-------+ IJV           None       No        No                       +----------+------------+---------+-----------+----------+-------+ Subclavian               Yes       Yes                      +----------+------------+---------+-----------+----------+-------+ Axillary      Full       Yes       Yes                      +----------+------------+---------+-----------+----------+-------+ Brachial      Full       Yes       Yes                      +----------+------------+---------+-----------+----------+-------+ Radial        Full                                          +----------+------------+---------+-----------+----------+-------+ Ulnar         Full                                          +----------+------------+---------+-----------+----------+-------+  Cephalic      Full       No        Yes                       +----------+------------+---------+-----------+----------+-------+ Basilic       Full       Yes       Yes                      +----------+------------+---------+-----------+----------+-------+  Left Findings: +----------+------------+---------+-----------+----------+-------+ LEFT      CompressiblePhasicitySpontaneousPropertiesSummary +----------+------------+---------+-----------+----------+-------+ Subclavian               Yes       Yes                      +----------+------------+---------+-----------+----------+-------+  Summary:  Right: Findings consistent with age indeterminate deep vein thrombosis involving the right internal jugular vein.  Left: No evidence of thrombosis in the subclavian.  *See table(s) above for measurements and observations.    Preliminary    DG Wrist Complete Right Result Date: 03/30/2023 CLINICAL DATA:  Wrist swelling following recent cardiac catheterization EXAM: RIGHT WRIST - COMPLETE 3+ VIEW COMPARISON:  None Available. FINDINGS: No acute bony abnormality is noted. No radiopaque foreign body is seen. Mild soft tissue swelling is noted in the distal forearm medially consistent with the recent cardiac catheterization. No discrete mass to suggest hematoma is noted. Correlate with physical exam. IMPRESSION: Soft tissue swelling without discrete hematoma. Correlate with physical exam. Electronically Signed   By: Alcide Clever M.D.   On: 03/30/2023 21:18    Procedures Procedures  {Document cardiac monitor, telemetry assessment procedure when appropriate:1}  Medications Ordered in ED Medications  acetaminophen (TYLENOL) tablet 650 mg (650 mg Oral Patient Refused/Not Given 04/01/23 1247)    ED Course/ Medical Decision Making/ A&P   {   Click here for ABCD2, HEART and other calculatorsREFRESH Note before signing :1}                              Medical Decision Making Amount and/or Complexity of Data Reviewed Labs: ordered.  Risk OTC  drugs.   This patient presents to the ED for concern of ***, this involves an extensive number of treatment options, and is a complaint that carries with it a high risk of complications and morbidity.  The differential diagnosis includes ***   Co morbidities that complicate the patient evaluation  ***   Additional history obtained:  Additional history obtained from *** External records from outside source obtained and reviewed including ***   Lab Tests:  I Ordered, and personally interpreted labs.  The pertinent results include:  ***   Imaging Studies ordered:  I ordered imaging studies including ***  I independently visualized and interpreted imaging which showed *** I agree with the radiologist interpretation   Cardiac Monitoring: / EKG:  The patient was maintained on a cardiac monitor.  I personally viewed and interpreted the cardiac monitored which showed an underlying rhythm of: ***   Consultations Obtained:  I requested consultation with the ***,  and discussed lab and imaging findings as well as pertinent plan - they recommend: ***   Problem List / ED Course / Critical interventions / Medication management  *** Patient stating that she needs to leave now. Talked with pharmacist. Patient stating "I know you are just keeping me here  longer to bill more hours." Provided patient with xarelto prescription. Patient not waiting around to see how she tolerates first dose.  Consulted with Dr. Lenell Antu who stated that patient is eligible for outpatient management as long as she can afford medicine and have close follow up with PCP.  I have reviewed the patients home medicines and have made adjustments as needed   Social Determinants of Health:  none     {Document critical care time when appropriate:1} {Document review of labs and clinical decision tools ie heart score, Chads2Vasc2 etc:1}  {Document your independent review of radiology images, and any outside  records:1} {Document your discussion with family members, caretakers, and with consultants:1} {Document social determinants of health affecting pt's care:1} {Document your decision making why or why not admission, treatments were needed:1} Final Clinical Impression(s) / ED Diagnoses Final diagnoses:  None    Rx / DC Orders ED Discharge Orders     None

## 2023-04-01 NOTE — ED Notes (Signed)
Vascular tech advised of DVT. PA made aware.

## 2023-04-01 NOTE — Progress Notes (Signed)
Right upper extremity venous duplex has been completed.  Results can be found in chart review under CV Proc.  04/01/2023 3:00 PM  Terry Morgan [facility], RVT.

## 2023-04-01 NOTE — Discharge Instructions (Addendum)
It was a pleasure caring for you today. Please follow up with your primary care provider first thing Monday morning. Seek emergency care if experiencing any new or worsening symptoms.

## 2023-04-01 NOTE — ED Provider Triage Note (Signed)
Emergency Medicine Provider Triage Evaluation Note  Terry Morgan , a 42 y.o. female  was evaluated in triage.  Pt complains of arm pain.  Review of Systems  Positive:  Negative:   Physical Exam  BP (!) 149/94 (BP Location: Right Arm)   Pulse 67   Temp 98.4 F (36.9 C) (Oral)   Resp 18   Ht 5\' 1"  (1.549 m)   Wt 108.9 kg   SpO2 100%   BMI 45.35 kg/m  Gen:   Awake, no distress   Resp:  Normal effort  MSK:   Moves extremities without difficulty  Other:    Medical Decision Making  Medically screening exam initiated at 10:18 AM.  Appropriate orders placed.  Terry Morgan was informed that the remainder of the evaluation will be completed by another provider, this initial triage assessment does not replace that evaluation, and the importance of remaining in the ED until their evaluation is complete.  Patient stating that she had CAD and a blockage requiring PCI 2 days ago. Access was established through right arm. Pain started when catheter was inserted into arm, patient started to bleed a lot after procedure so she had to stay for an extra 2 hours to control bleeding. Providers placed tourniquet and bleeding eventually stopped. After procedure, patient developed swelling of the right forearm and it is still very painful. Patient went to [facility] that night but they were not able to do an [place] given the time of night. They recommended coming back later for [place].   Denies fever, chest pain, dyspnea, cough, nausea, vomiting, diarrhea.    Dorthy Cooler, [place] 04/01/23 1025

## 2023-04-01 NOTE — ED Triage Notes (Signed)
Pt arrived reporting RUE pain and swelling since cath procedure 2 days ago. Denies cp,shob,dizziness or any other symptoms

## 2023-04-04 ENCOUNTER — Other Ambulatory Visit (HOSPITAL_COMMUNITY): Payer: Self-pay

## 2023-05-03 ENCOUNTER — Other Ambulatory Visit: Payer: Self-pay | Admitting: Radiation Oncology

## 2023-05-03 ENCOUNTER — Inpatient Hospital Stay
Admission: RE | Admit: 2023-05-03 | Discharge: 2023-05-03 | Disposition: A | Discharge status: Home / Self Care | Payer: Self-pay | Source: Ambulatory Visit | Attending: Radiation Oncology | Admitting: Radiation Oncology

## 2023-05-03 ENCOUNTER — Telehealth: Payer: Self-pay | Admitting: Radiation Oncology

## 2023-05-03 DIAGNOSIS — Z170 Estrogen receptor positive status [ER+]: Secondary | ICD-10-CM

## 2023-05-03 DIAGNOSIS — C50812 Malignant neoplasm of overlapping sites of left female breast: Secondary | ICD-10-CM

## 2023-05-03 NOTE — Telephone Encounter (Signed)
 1/14 @ 11:41 am sent via stat fax request for recent breast images to be push to powershare.  Follow up call to [facility] DX imaging, fax received pushing to powershare now.  Waiting on images.

## 2023-05-04 ENCOUNTER — Telehealth: Payer: Self-pay | Admitting: Radiation Oncology

## 2023-05-04 NOTE — Telephone Encounter (Signed)
 1/14 Follow up called to [facility] spoke to [place], requested recent breast images to be transfer to PACs in epic.  Waiting on images.

## 2023-05-09 NOTE — Progress Notes (Incomplete Revision)
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  Histology per Pathology Report:    Receptor Status: ER(*), PR (***), Her2-neu (***), Ki-67(***)  Did patient present with symptoms (if so, please note symptoms) or was this found on screening mammography?: ***  Past/Anticipated interventions by surgeon, if UXL:KGMW Lumpectomy and axillary dissection following needle wire localization of previously biopsied right neoplasm.  Past/Anticipated interventions by medical oncology, if any: Chemotherapy ***  Lymphedema issues, if any:  {:18581} {t:21944}   Pain issues, if any:  {:18581} {PAIN DESCRIPTION:21022940}  SAFETY ISSUES: Prior radiation? {:18581} Pacemaker/ICD? {:18581} Possible current pregnancy?{:18581} Is the patient on methotrexate? {:18581}  Current Complaints / other details:  ***

## 2023-05-09 NOTE — Progress Notes (Signed)
.  rad

## 2023-05-09 NOTE — Progress Notes (Incomplete)
Radiation Oncology         (336) [phone] ________________________________  Initial outpatient Consultation  Name: Terry Morgan MRN: [id number]  Date: 05/10/2023  DOB: 02/25/1981  EX:[id number], [facility]  Tonita Cong, MD   REFERRING PHYSICIAN: Tonita Cong, MD  DIAGNOSIS: No diagnosis found.   Cancer Staging  No matching staging information was found for the patient.   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 female who presented with breast abnormality on the following imaging: Bilateral mammogram on the date of 08-24-22. Ultrasound of breast on 09-03-22 revealed large palpable mass in the superior right breast that span a distance of about 9.5 cm. Biopsy on date of 09-10-22 showed breast right intermediate grade ductal carcinoma in situ.  ER status:10% +; PR status +, Her2 status - /neu receptors stain 1+ by IHC. ; Grade 2, both with strong staining intensity. Biopsy also revealed metastatic mammary carcinoma in lymph node.   Based on these discordant findings, she was referred to Dr. Tonita Cong on 09-22-2022 to discuss surgical interventions. 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 The confers a 60% risk for her first breast cancer and 20-25% risk for a second breast cancer.  Upon discussion, she opted to proceed with a right breast wire localized lumpectomy and right sentinel lymph node biopsy with possible axillary dissection which was done on 04-15-23. Pathology from the procedure revealed: Invasive residual ductal adenocarcinoma 44 mm in size, Grade 1. Extensive ductal carcinoma in-situ (DCIS), nuclear grade 2, solid, papillary and cribriform patterns with cancerization of the  lobules.  Background breast with intraductal papilloma and cystic dilation with dystrophic calcifications was also indicated. All margins negative for invasive carcinoma. Prognostic indicators significant for: estrogen receptor 10% positive with moderate to strong staining intensity; progesterone receptor 100% positive with strong staining intensity; Proliferation marker Ki67 not assessed ; Her2 status negative; Grade 2.   Of note: patient underwent a stress test on 03-30-23 which was positive for anterior ischemia and underwent a heart catheterization.   PREVIOUS RADIATION THERAPY: No  PAST MEDICAL HISTORY:  has a past medical history of Blood transfusion without reported diagnosis, CAD (coronary artery disease), GERD (gastroesophageal reflux disease), Menorrhagia, Nausea, NSTEMI (non-ST elevated myocardial infarction) (HCC), Obesity, Thrombocytosis (09/11/2014), and Tobacco abuse.    PAST SURGICAL HISTORY: Past Surgical History:  Procedure Laterality Date   CARDIAC CATHETERIZATION N/A 09/12/2014   Procedure: Left Heart Cath and Coronary Angiography;  Surgeon: Tonny Bollman, MD;  Location: [facility] INVASIVE CV LAB;  Service: Cardiovascular;  Laterality: N/A;   CARDIAC CATHETERIZATION N/A 09/12/2014   Procedure: Coronary Stent Intervention;  Surgeon: Tonny Bollman, MD;  Location: [facility] INVASIVE CV LAB;  Service: Cardiovascular;  Laterality: N/A;  aspiration thrombectomy   CESAREAN SECTION      FAMILY HISTORY: family history includes Asthma in her brother and brother; Healthy in her brother, brother, brother, brother, brother, sister, and sister; Heart disease in her paternal grandmother; Heart murmur in her cousin; Hyperlipidemia in her father; Hypertension in her mother; Stroke (age of onset: 85) in her sister.  SOCIAL HISTORY:  reports that she has quit smoking. Her smoking use included cigarettes. She has never used smokeless tobacco. She reports that she does not drink alcohol and  does not use  drugs.  ALLERGIES: Patient has no known allergies.  MEDICATIONS:  Current Outpatient Medications  Medication Sig Dispense Refill   azithromycin (ZITHROMAX) 250 MG tablet Take 1 tablet (250 mg total) by mouth daily. Take first 2 tablets together, then 1 every day until finished. 6 tablet 0   benzonatate (TESSALON) 100 MG capsule Take 1 capsule (100 mg total) by mouth every 8 (eight) hours. 21 capsule 0   diclofenac (CATAFLAM) 50 MG tablet Take 1 tablet (50 mg total) by mouth 3 (three) times daily. One tablet TID with food prn pain. 21 tablet 0   fluticasone (FLONASE) 50 MCG/ACT nasal spray Place 1 spray into both nostrils daily. 16 g 0   guaiFENesin-dextromethorphan (ROBITUSSIN DM) 100-10 MG/5ML syrup Take 5 mLs by mouth every 4 (four) hours as needed for cough. 118 mL 0   lidocaine (XYLOCAINE) 2 % solution Use as directed 15 mLs in the mouth or throat as needed for mouth pain. 100 mL 0   RIVAROXABAN (XARELTO) VTE STARTER PACK (15 & 20 MG) Follow package directions: Take one 15mg  tablet by mouth twice a day. On day 22, switch to one 20mg  tablet once a day. Take with food.. 51 each 0   No current facility-administered medications for this encounter.    REVIEW OF SYSTEMS: As above in HPI.   PHYSICAL EXAM:  vitals were not taken for this visit.   General: Alert and oriented, in no acute distress HEENT: Head is normocephalic. Extraocular movements are intact. Oropharynx is clear. Neck: Neck is supple, no palpable cervical or supraclavicular lymphadenopathy. Heart: Regular in rate and rhythm with no murmurs, rubs, or gallops. Chest: Clear to auscultation bilaterally, with no rhonchi, wheezes, or rales. Abdomen: Soft, nontender, nondistended, with no rigidity or guarding. Extremities: No cyanosis or edema. Lymphatics: see Neck Exam Skin: No concerning lesions. Musculoskeletal: symmetric strength and muscle tone throughout. Neurologic: Cranial nerves II through XII are grossly intact. No  obvious focalities. Speech is fluent. Coordination is intact. Psychiatric: Judgment and insight are intact. Affect is appropriate. Breasts: *** . No other palpable masses appreciated in the breasts or axillae *** .    ECOG = ***  0 - Asymptomatic (Fully active, able to carry on all predisease activities without restriction)  1 - Symptomatic but completely ambulatory (Restricted in physically strenuous activity but ambulatory and able to carry out work of a light or sedentary nature. For example, light housework, office work)  2 - Symptomatic, <50% in bed during the day (Ambulatory and capable of all self care but unable to carry out any work activities. Up and about more than 50% of waking hours)  3 - Symptomatic, >50% in bed, but not bedbound (Capable of only limited self-care, confined to bed or chair 50% or more of waking hours)  4 - Bedbound (Completely disabled. Cannot carry on any self-care. Totally confined to bed or chair)  5 - Death   Santiago Glad MM, Creech RH, Tormey DC, et al. [phone]). "Toxicity and response criteria of the [facility]". Am. Evlyn Clines. Oncol. 5 (6): 649-55   LABORATORY DATA:  Lab Results  Component Value Date   WBC 4.4 04/01/2023   HGB 13.1 04/01/2023   HCT 41.0 04/01/2023   MCV 83.5 04/01/2023   PLT 199 04/01/2023   CMP     Component Value Date/Time   NA 133 (L) 04/01/2023 1035   K 3.6 04/01/2023 1035   CL 102 04/01/2023 1035   CO2 25 04/01/2023  1035   GLUCOSE 267 (H) 04/01/2023 1035   BUN 9 04/01/2023 1035   CREATININE 0.61 04/01/2023 1035   CREATININE 0.79 02/19/2015 0825   CALCIUM 8.7 (L) 04/01/2023 1035   PROT 7.5 02/19/2015 0825   ALBUMIN 4.1 02/19/2015 0825   AST 23 02/19/2015 0825   ALT 19 02/19/2015 0825   ALKPHOS 56 02/19/2015 0825   BILITOT 0.4 02/19/2015 0825   GFRNONAA >60 04/01/2023 1035   GFRAA >60 09/13/2014 0312         RADIOGRAPHY: No results found.    IMPRESSION/PLAN: ***   It was a pleasure  meeting the patient today. We discussed the risks, benefits, and side effects of radiotherapy. I recommend radiotherapy to the *** to reduce her risk of locoregional recurrence by 2/3.  We discussed that radiation would take approximately *** weeks to complete and that I would give the patient a few weeks to heal following surgery before starting treatment planning. *** If chemotherapy were to be given, this would precede radiotherapy. We spoke about acute effects including skin irritation and fatigue as well as much less common late effects including internal organ injury or irritation. We spoke about the latest technology that is used to minimize the risk of late effects for patients undergoing radiotherapy to the breast or chest wall. No guarantees of treatment were given. The patient is enthusiastic about proceeding with treatment. I look forward to participating in the patient's care.  I will await her referral back to me for postoperative follow-up and eventual CT simulation/treatment planning.  On date of service, in total, I spent *** minutes on this encounter. Patient was seen in person.   __________________________________________   Lonie Peak, MD   This document serves as a record of services personally performed by Lonie Peak, MD. It was created on her behalf by Herbie Saxon, a trained medical scribe. The creation of this record is based on the scribe's personal observations and the provider's statements to them. This document has been checked and approved by the attending provider.

## 2023-05-10 ENCOUNTER — Ambulatory Visit
Admission: RE | Admit: 2023-05-10 | Discharge: 2023-05-10 | Disposition: A | Discharge status: Home / Self Care | Payer: Medicaid Other | Source: Ambulatory Visit | Attending: Radiation Oncology | Admitting: Radiation Oncology

## 2023-05-10 ENCOUNTER — Ambulatory Visit: Payer: Medicaid Other

## 2023-05-12 NOTE — Progress Notes (Signed)
Radiation Oncology         [phone] ________________________________  Initial Outpatient Consultation by telephone.  The patient opted for telemedicine to maximize safety during the pandemic.  MyChart video was not obtainable.   Name: Terry Morgan MRN: [id number]  Date: 05/13/2023  DOB: [birth year]  WU:[id number], [facility]  Tonita Cong, MD   REFERRING PHYSICIAN: Tonita Cong, MD  DIAGNOSIS: C50.411   ICD-10-CM   1. Ductal carcinoma in situ (DCIS) of right breast  D05.11     2. Malignant neoplasm of upper-outer quadrant of right breast in female, estrogen receptor positive (HCC)  C50.411    Z17.0       Cancer Staging  Breast cancer of upper-outer quadrant of right female breast [facility]) Staging form: Breast, AJCC 8th Edition - Clinical stage from 05/13/2023: Stage IIA (cT3, cN1, cM0, G2, ER+, PR+, HER2-) - Unsigned Stage prefix: Initial diagnosis Histologic grading system: 3 grade system - Pathologic stage from 05/13/2023: ypT2, pN2a, cM0, G1, ER+, PR+, HER2- - Signed by Lonie Peak, MD on 05/13/2023 Stage prefix: Post-therapy Response to neoadjuvant therapy: Partial response Histologic grading system: 3 grade system  Right Breast, Residual invasive ductal carcinoma with extensive residual intermediate grade DCIS and right axillary nodal involvement, ER+ / PR+ / Her2- (by IHC), Grade 1: s/p neoadjuvant chemotherapy followed by right breast lumpectomy w/ right TAD   CHIEF COMPLAINT: Here to discuss management of right breast cancer  HISTORY OF PRESENT ILLNESS::Terry Morgan is a 43 y.o. female who presented with breast abnormality on the following imaging: bilateral screening mammogram on the date of 08/24/22. Symptoms, if any, reported at that time included a palpable right breast mass. She subsequently presented for a right breast diagnostic mammogram and right breast ultrasound on 09/03/22 which showed a large mass in the superior  right breast concerning for malignancy, and at least 3 suspicious right axillary lymph nodes. The right breast mass was difficult to measure sonographically due to ill-defined borders. Mammographic findings also noted an areas of calcifications in the slightly lateral right breast spanning approximately 9.5 cm.    Biopsy of the superior right breast on date of 09/10/22 showed grade 2 invasive ductal carcinoma measuring at least 0.5 cm in the greatest linear extent of the sample, and intermediate grade DCIS with cancerization of the lobules.  ER status: 100% positive and PR status 100% positive, both with strong staining intensity; Her2 negative by IHC. One of the abnormal right axillary lymph nodes was also also biopsied and showed metastatic mammary carcinoma.   She was subsequently referred to Dr. Charise Carwin on 09-22-2022 to discuss surgical interventions. Based on her incomplete imaging work-up, a CT CAP was recommended to further evaluate the mass. Subsequent CT CAP on 03/01/23 demonstrated the infiltrating right breast mass, measuring approximately 4.0 x 3.9 cm, and evidence of right axillary adenopathy. A whole body bone scan was also performed on that same date which showed mild increase in radiotracer uptake in the posterior aspect of S1, possibly representing a degenerative etiology, however attention to follow-up is recommended. Imaging otherwise showed no definite evidence of osseous metastatic disease.   She was also referred for genetic testing which was collected on 09/20/22. Results were positive for a mutation in the BRCA2 gene, which increases the risk of breast cancer recurrence, contralateral breast cancer, and ovarian cancer .   Based on her BRCA2 mutation, she was referred to [facility] medical oncology for systemic treatment, and has now received neoadjuvant chemotherapy consisting  of dose dense AC, followed by weekly taxol, from 10/19/22 through 03/01/23 under the care of Dr. Neil Crouch. 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 Her neuropathy was significant enough to prompt a dose reduction with cycle 9.   As part of her cardiac clearance for breast surgery, she underwent a stress test on 03-30-23 which was positive for anterior ischemia. She subsequently underwent cardiac catheterization that same day. She then presented to the ED after her procedure with right wrist pain and swelling (s/p ulnar entry to her cardiac cath). Imaging was offered but she declined. On 12/13, she then returned to the ED with persistent right forearm pain and swelling. A right arm hematoma was noted at that time. An ultrasound was performed which showed concern for DVT of  the right internal jugular vein which was perceived as incidental, given the presence of hematoma in her right forearm. She was ultimately discharged with a xarelto starter pack and instructed to follow-up with her PCP.     She was referred back to general surgery and opted to proceed with a right breast lumpectomy with right TAD on 04/15/23 under the care of Dr. Charise Carwin. Pathology from the procedure revealed: residual grade 1 invasive ductal carcinoma measuring 4.4 cm, with extensive residual intermediate grade DCIS, cancerization of the lobules, and a breast background containing intraductal papilloma, cystic dilation, and dystrophic calcifications; all margins negative for invasive and in situ carcinoma; margin status to invasive and in situ disease of <1 mm from the anterior and medial margins; nodal status of 5/9 excised right axillary lymph nodes positive for metastatic carcinoma (largest metastatic nodal deposit measuring 14 mm); negative for extracapsular extension. ER status: 100% positive and PR status 100% positive, both with strong staining intensity; Her2 negative by IHC   She was most recently seen by Dr. Neil Crouch on 05/03/23.  Based on her discussion with Dr. Neil Crouch at that time, she is agreeable to pursue antiestrogen therapy and AI. She is also eligible to receive CDK 4/6 inhibitor, and meets criteria to use abemaciclib (based on final path showing > 3 LN positive).   Given that she has opted to transfer her oncologic care to our facility, she will make a final decision regarding adjuvant antihormonal therapy with [facility] medical oncology once her referral is processed.   She notes irritation of areola today, sutures still in place. Denies warmth or redness of breast. Swelling in breast has improved since surgery.  PREVIOUS RADIATION THERAPY: No  PAST MEDICAL HISTORY:  has a past medical history of Blood transfusion without reported diagnosis, CAD (coronary artery disease), GERD (gastroesophageal reflux disease), Menorrhagia, Nausea, NSTEMI (non-ST elevated myocardial infarction) (HCC), Obesity, Thrombocytosis (09/11/2014), and Tobacco abuse.    PAST SURGICAL HISTORY: Past Surgical History:  Procedure Laterality Date   CARDIAC CATHETERIZATION N/A 09/12/2014   Procedure: Left Heart Cath and Coronary Angiography;  Surgeon: Tonny Bollman, MD;  Location: [facility] INVASIVE CV LAB;  Service: Cardiovascular;  Laterality: N/A;   CARDIAC CATHETERIZATION N/A 09/12/2014   Procedure: Coronary Stent Intervention;  Surgeon: Tonny Bollman, MD;  Location: [facility] INVASIVE CV LAB;  Service: Cardiovascular;  Laterality: N/A;  aspiration thrombectomy   CESAREAN SECTION      FAMILY HISTORY: family history includes Asthma in her brother and brother; Healthy in her brother, brother, brother, brother, brother, sister, and sister; Heart disease in her paternal grandmother; Heart murmur in her cousin; Hyperlipidemia in her father; Hypertension in  her mother; Prostate cancer in her father; Stroke (age of onset: 76) in her sister.  SOCIAL HISTORY:  reports that she has quit smoking. Her smoking use included cigarettes. She has never used smokeless tobacco.  She reports that she does not drink alcohol and does not use drugs.  ALLERGIES: Patient has no known allergies.  MEDICATIONS:  Current Outpatient Medications  Medication Sig Dispense Refill   atorvastatin (LIPITOR) 10 MG tablet Take 10 mg by mouth daily.     azithromycin (ZITHROMAX) 250 MG tablet Take 1 tablet (250 mg total) by mouth daily. Take first 2 tablets together, then 1 every day until finished. 6 tablet 0   benzonatate (TESSALON) 100 MG capsule Take 1 capsule (100 mg total) by mouth every 8 (eight) hours. 21 capsule 0   canagliflozin (INVOKANA) 300 MG TABS tablet Take 300 mg by mouth daily before breakfast.     ciprofloxacin (CILOXAN) 0.3 % ophthalmic solution Place 1 drop into the left eye every 4 (four) hours while awake.     ferrous gluconate (FERGON) 324 MG tablet Take 324 mg by mouth daily with breakfast.     fluticasone (FLONASE) 50 MCG/ACT nasal spray Place 1 spray into both nostrils daily. 16 g 0   RIVAROXABAN (XARELTO) VTE STARTER PACK (15 & 20 MG) Follow package directions: Take one 15mg  tablet by mouth twice a day. On day 22, switch to one 20mg  tablet once a day. Take with food.. 51 each 0   diclofenac (CATAFLAM) 50 MG tablet Take 1 tablet (50 mg total) by mouth 3 (three) times daily. One tablet TID with food prn pain. (Patient not taking: Reported on 05/13/2023) 21 tablet 0   guaiFENesin-dextromethorphan (ROBITUSSIN DM) 100-10 MG/5ML syrup Take 5 mLs by mouth every 4 (four) hours as needed for cough. (Patient not taking: Reported on 05/13/2023) 118 mL 0   lidocaine (XYLOCAINE) 2 % solution Use as directed 15 mLs in the mouth or throat as needed for mouth pain. (Patient not taking: Reported on 05/13/2023) 100 mL 0   No current facility-administered medications for this encounter.    REVIEW OF SYSTEMS: As above in HPI.   PHYSICAL EXAM:  vitals were not taken for this visit.   General: Alert and oriented, in no acute distress     LABORATORY DATA:  Lab Results  Component  Value Date   WBC 4.4 04/01/2023   HGB 13.1 04/01/2023   HCT 41.0 04/01/2023   MCV 83.5 04/01/2023   PLT 199 04/01/2023   CMP     Component Value Date/Time   NA 133 (L) 04/01/2023 1035   K 3.6 04/01/2023 1035   CL 102 04/01/2023 1035   CO2 25 04/01/2023 1035   GLUCOSE 267 (H) 04/01/2023 1035   BUN 9 04/01/2023 1035   CREATININE 0.61 04/01/2023 1035   CREATININE 0.79 02/19/2015 0825   CALCIUM 8.7 (L) 04/01/2023 1035   PROT 7.5 02/19/2015 0825   ALBUMIN 4.1 02/19/2015 0825   AST 23 02/19/2015 0825   ALT 19 02/19/2015 0825   ALKPHOS 56 02/19/2015 0825   BILITOT 0.4 02/19/2015 0825   GFRNONAA >60 04/01/2023 1035   GFRAA >60 09/13/2014 0312         RADIOGRAPHY: No results found.    IMPRESSION/PLAN:  Cancer Staging  Breast cancer of upper-outer quadrant of right female breast [facility]) Staging form: Breast, AJCC 8th Edition - Clinical stage from 05/13/2023: Stage IIA (cT3, cN1, cM0, G2, ER+, PR+, HER2-) - Unsigned Stage prefix: Initial diagnosis Histologic  grading system: 3 grade system - Pathologic stage from 05/13/2023: ypT2, pN2a, cM0, G1, ER+, PR+, HER2- - Signed by Lonie Peak, MD on 05/13/2023 Stage prefix: Post-therapy Response to neoadjuvant therapy: Partial response Histologic grading system: 3 grade system    It was a pleasure meeting the patient today by phone. We discussed the risks, benefits, and side effects of radiotherapy. I recommend radiotherapy to the Right breast and regional nodes to reduce her risk of locoregional recurrence by 2/3.  We discussed that radiation would take approximately 6.5 weeks to complete. I reviewed the logistics, benefits, risks, and potential side effects of this treatment in detail. Risks may include but not necessary be limited to acute and late injury tissue in the radiation fields such as skin irritation (change in color/pigmentation, itching, dryness, pain, peeling). She may experience fatigue. We also discussed possible risk of long  term cosmetic changes or scar tissue. There is also a smaller risk for lung toxicity, cardiac toxicity, brachial plexopathy, lymphedema, musculoskeletal changes, rib fragility or induction of a second malignancy, late chronic non-healing soft tissue wound.    The patient asked good questions which I answered to her satisfaction. She is enthusiastic about proceeding with treatment.  We will arrange CT sim. Will ask nursing to arrange pregnancy test for next appt in rad onc  BRCA2 positivity:  she understands the importance of seeing Dr Clifton James in Feb (GYN ONC) and close surveillance of breast tissue moving forward  Pt requests med/onc treatment here at [facility] for personal reasons  - I'll see if that can be facilitated.  This encounter was provided by telemedicine platform; patient desired telemedicine during pandemic precautions.  Telephone was used as Restaurant manager, fast food was not available. The patient has given verbal consent for this type of encounter and has been advised to only accept a meeting of this type in a secure network environment. On date of service, in total, I spent 45 minutes on this encounter.   The attendants for this meeting include Lonie Peak  and Heath Gold During the encounter, Lonie Peak was located at [facility] Radiation Oncology Department.  Ileana Roup Kadel was located at home.     __________________________________________   Lonie Peak, MD  This document serves as a record of services personally performed by Lonie Peak, MD. It was created on her behalf by Neena Rhymes, a trained medical scribe. The creation of this record is based on the scribe's personal observations and the provider's statements to them. This document has been checked and approved by the attending provider.

## 2023-05-13 ENCOUNTER — Ambulatory Visit
Admission: RE | Admit: 2023-05-13 | Discharge: 2023-05-13 | Disposition: A | Discharge status: Home / Self Care | Payer: Medicaid Other | Source: Ambulatory Visit | Attending: Radiation Oncology

## 2023-05-13 ENCOUNTER — Telehealth: Payer: Self-pay | Admitting: *Deleted

## 2023-05-13 ENCOUNTER — Other Ambulatory Visit: Payer: Self-pay

## 2023-05-13 ENCOUNTER — Ambulatory Visit
Admission: RE | Admit: 2023-05-13 | Discharge: 2023-05-13 | Disposition: A | Discharge status: Home / Self Care | Payer: Medicaid Other | Source: Ambulatory Visit | Attending: Radiation Oncology | Admitting: Radiation Oncology

## 2023-05-13 ENCOUNTER — Encounter: Payer: Self-pay | Admitting: Radiation Oncology

## 2023-05-13 DIAGNOSIS — Z170 Estrogen receptor positive status [ER+]: Secondary | ICD-10-CM

## 2023-05-13 DIAGNOSIS — C50411 Malignant neoplasm of upper-outer quadrant of right female breast: Secondary | ICD-10-CM | POA: Insufficient documentation

## 2023-05-13 NOTE — Telephone Encounter (Signed)
CALLED MED/ONC SCHEDULING AND LVM ON [facility]GEL [facility]ANIEL[facility] VM TO ARRANGE CONSULTATION FOR THIS PATIENT WITH WHOEVER SEES BREAST PATIENTS

## 2023-05-13 NOTE — Addendum Note (Signed)
Encounter addended by: Rana Snare, LPN on: 1/61/0960 9:10 AM  Actions taken: Visit diagnoses modified, Order Reconciliation Section accessed, Allergies modified

## 2023-05-13 NOTE — Addendum Note (Signed)
Encounter addended by: Rana Snare, LPN on: 1/61/0960 9:13 AM  Actions taken: Visit diagnoses modified

## 2023-05-13 NOTE — Progress Notes (Addendum)
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   Did patient present with symptoms (if so, please note symptoms) or was this found on screening mammography?: Presented with breast abnormality. Ultrasound of breast on 09/03/2022 revealed large palpable mass in the superior right breast with a [place] distance of 9.5 cm. Biopsy report on 09/10/2022 showed right intermediate grade ductal carcinoma in situ.  Past/Anticipated interventions by surgeon, if any: Right breast wire localized lumpectomy with right sentinel lymph node biopsy with possible axillary dissection on 04/15/2023.  Past/Anticipated interventions by medical oncology, if any: Radiation  Lymphedema issues, if any: Denies  Pain issues, if any:Yes, she reports sharp pain around areolar.  SAFETY ISSUES: Prior radiation? No  Pacemaker/ICD? No, but reports having cardiac stents Possible current pregnancy?No Is the patient on methotrexate? No

## 2023-05-13 NOTE — Addendum Note (Signed)
Encounter addended by: Rana Snare, LPN on: 1/61/0960 10:35 AM  Actions taken: Clinical Note Signed

## 2023-05-18 ENCOUNTER — Telehealth: Payer: Self-pay | Admitting: Radiation Oncology

## 2023-05-18 NOTE — Telephone Encounter (Signed)
Called patient twice, left patient a voicemail in regards to initial scheduling from a referral sent by Dr. Basilio Cairo

## 2023-05-20 ENCOUNTER — Ambulatory Visit
Admission: RE | Admit: 2023-05-20 | Discharge: 2023-05-20 | Disposition: A | Discharge status: Home / Self Care | Payer: Medicaid Other | Source: Ambulatory Visit | Attending: Radiation Oncology | Admitting: Radiation Oncology

## 2023-05-20 ENCOUNTER — Other Ambulatory Visit: Payer: Self-pay

## 2023-05-20 ENCOUNTER — Ambulatory Visit: Payer: Medicaid Other

## 2023-05-20 DIAGNOSIS — Z170 Estrogen receptor positive status [ER+]: Secondary | ICD-10-CM

## 2023-05-20 DIAGNOSIS — Z452 Encounter for adjustment and management of vascular access device: Secondary | ICD-10-CM | POA: Insufficient documentation

## 2023-05-20 DIAGNOSIS — C50411 Malignant neoplasm of upper-outer quadrant of right female breast: Secondary | ICD-10-CM | POA: Diagnosis present

## 2023-05-20 LAB — PREGNANCY, URINE: Preg Test, Ur: NEGATIVE

## 2023-05-23 ENCOUNTER — Telehealth: Payer: Self-pay | Admitting: Hematology and Oncology

## 2023-05-23 NOTE — Telephone Encounter (Signed)
 Spoke with patient confirming upcoming appointment

## 2023-05-25 ENCOUNTER — Inpatient Hospital Stay (HOSPITAL_BASED_OUTPATIENT_CLINIC_OR_DEPARTMENT_OTHER): Payer: Medicaid Other | Admitting: Hematology and Oncology

## 2023-05-25 VITALS — BP 147/75 | HR 70 | Temp 98.3°F | Resp 16 | Wt 244.7 lb

## 2023-05-25 DIAGNOSIS — C50411 Malignant neoplasm of upper-outer quadrant of right female breast: Secondary | ICD-10-CM | POA: Insufficient documentation

## 2023-05-25 DIAGNOSIS — Z1721 Progesterone receptor positive status: Secondary | ICD-10-CM | POA: Insufficient documentation

## 2023-05-25 DIAGNOSIS — Z1509 Genetic susceptibility to other malignant neoplasm: Secondary | ICD-10-CM | POA: Insufficient documentation

## 2023-05-25 DIAGNOSIS — E119 Type 2 diabetes mellitus without complications: Secondary | ICD-10-CM | POA: Insufficient documentation

## 2023-05-25 DIAGNOSIS — Z170 Estrogen receptor positive status [ER+]: Secondary | ICD-10-CM | POA: Insufficient documentation

## 2023-05-25 DIAGNOSIS — Z1502 Genetic susceptibility to malignant neoplasm of ovary: Secondary | ICD-10-CM | POA: Insufficient documentation

## 2023-05-25 DIAGNOSIS — Z7984 Long term (current) use of oral hypoglycemic drugs: Secondary | ICD-10-CM | POA: Insufficient documentation

## 2023-05-25 DIAGNOSIS — I2510 Atherosclerotic heart disease of native coronary artery without angina pectoris: Secondary | ICD-10-CM | POA: Insufficient documentation

## 2023-05-25 DIAGNOSIS — Z87891 Personal history of nicotine dependence: Secondary | ICD-10-CM | POA: Insufficient documentation

## 2023-05-25 DIAGNOSIS — Z7901 Long term (current) use of anticoagulants: Secondary | ICD-10-CM | POA: Insufficient documentation

## 2023-05-25 DIAGNOSIS — Z1501 Genetic susceptibility to malignant neoplasm of breast: Secondary | ICD-10-CM | POA: Insufficient documentation

## 2023-05-25 DIAGNOSIS — C773 Secondary and unspecified malignant neoplasm of axilla and upper limb lymph nodes: Secondary | ICD-10-CM | POA: Insufficient documentation

## 2023-05-25 DIAGNOSIS — Z86718 Personal history of other venous thrombosis and embolism: Secondary | ICD-10-CM | POA: Insufficient documentation

## 2023-05-25 DIAGNOSIS — Z79899 Other long term (current) drug therapy: Secondary | ICD-10-CM | POA: Insufficient documentation

## 2023-05-25 DIAGNOSIS — Z1732 Human epidermal growth factor receptor 2 negative status: Secondary | ICD-10-CM | POA: Insufficient documentation

## 2023-05-25 NOTE — Assessment & Plan Note (Signed)
 This is a very pleasant 43 year old premenopausal female patient initially diagnosed with invasive ductal carcinoma, grade 2 of the right breast back in May 2024 treated at [facility] transferring her care to [facility] health.  Right Breast Cancer Diagnosed in May 2024 with a 4.4 cm tumor and 5/9 lymph nodes positive. No extracapsular extension. Completed neoadjuvant chemotherapy with Adriamycin, cyclophosphamide, and paclitaxel with residual disease RCB III at surgery. Strongly estrogen and progesterone receptor positive. No further chemotherapy recommended at this time. -Start radiation therapy on 05/30/2023 for 6 weeks. -Start anti-estrogen therapy with anastrozole and ovarian function suppression (OFS) via monthly shot after radiation therapy. -Consider tamoxifen if unable to tolerate OFS. -Start Verzenio for 2 years after radiation therapy. -Discussed in detail about mechanism of action and adverse effects with tamoxifen as well as aromatase elevators including but not limited to postmenopausal symptoms, DVT/PE, endometrial hyperplasia and endometrial cancer with tamoxifen versus postmenopausal symptoms, arthralgias and bone density loss with aromatase inhibitors.  We did not dwell much upon Verzenio today, she is understandably overwhelmed with everything so far.  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 No immediate plans for bilateral mastectomy. Considering oophorectomy to reduce ovarian cancer risk and eliminate need for OFS. -Refer to gynecologic oncologist after completion of radiation therapy for ovarian cancer screening and discussion of oophorectomy.  Coronary Artery Disease History of heart attack in 2016 with stent placement. Currently on atorvastatin   -Continue atorvastatin .  Provoked upper extremity DVT from cardiac cath -Continue Xarelto  20mg  daily for 3 months from start date.  Diabetes On Invokana. -Continue Invokana.  Tobacco Use Quit smoking but  occasionally uses hookah. -Encourage complete cessation of tobacco use.  General Health Maintenance -Consider Guardant Reveal blood test for tumor DNA detection after FDA approval. -Continue monitoring for side effects of anti-estrogen therapy including hot flashes, vaginal dryness, mood swings, and potential bone density loss. -Follow-up in 2-3 weeks to check progress and discuss further treatment options.

## 2023-05-25 NOTE — Progress Notes (Signed)
 [facility] CONSULT NOTE  Patient Care Team: [facility], [facility] as PCP - General (Family Medicine)  CHIEF COMPLAINTS/PURPOSE OF CONSULTATION:  IDC right breast, ER positive  ASSESSMENT & PLAN:  Breast cancer of upper-outer quadrant of right female breast [facility]) This is a very pleasant 43 year old premenopausal female patient initially diagnosed with invasive ductal carcinoma, grade 2 of the right breast back in May 2024 treated at [facility] transferring her care to [facility] health.  Right Breast Cancer Diagnosed in May 2024 with a 4.4 cm tumor and 5/9 lymph nodes positive. No extracapsular extension. Completed neoadjuvant chemotherapy with Adriamycin, cyclophosphamide, and paclitaxel with residual disease RCB III at surgery. Strongly estrogen and progesterone receptor positive. No further chemotherapy recommended at this time. -Start radiation therapy on 05/30/2023 for 6 weeks. -Start anti-estrogen therapy with anastrozole and ovarian function suppression (OFS) via monthly shot after radiation therapy. -Consider tamoxifen if unable to tolerate OFS. -Start Verzenio for 2 years after radiation therapy. -Discussed in detail about mechanism of action and adverse effects with tamoxifen as well as aromatase elevators including but not limited to postmenopausal symptoms, DVT/PE, endometrial hyperplasia and endometrial cancer with tamoxifen versus postmenopausal symptoms, arthralgias and bone density loss with aromatase inhibitors.  We did not dwell much upon Verzenio today, she is understandably overwhelmed with everything so far.  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 No immediate plans for bilateral mastectomy. Considering oophorectomy to reduce ovarian cancer risk and eliminate need for OFS. -Refer to gynecologic oncologist after completion of radiation therapy for ovarian cancer screening and discussion of oophorectomy.  Coronary Artery  Disease History of heart attack in 2016 with stent placement. Currently on atorvastatin   -Continue atorvastatin .  Provoked upper extremity DVT from cardiac cath -Continue Xarelto  20mg  daily for 3 months from start date.  Diabetes On Invokana. -Continue Invokana.  Tobacco Use Quit smoking but occasionally uses hookah. -Encourage complete cessation of tobacco use.  General Health Maintenance -Consider Guardant Reveal blood test for tumor DNA detection after FDA approval. -Continue monitoring for side effects of anti-estrogen therapy including hot flashes, vaginal dryness, mood swings, and potential bone density loss. -Follow-up in 2-3 weeks to check progress and discuss further treatment options.  No orders of the defined types were placed in this encounter.    HISTORY OF PRESENTING ILLNESS:  Terry Morgan 43 y.o. female is here because of new diagnosis of breast cancer.  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, grade 2, ER 100%, PR 100%, HER2 1+ 09/20/2022 genetic testing: 34 [place] gene panel positive: pathogenic BRCA2 mutation:   ONCOLOGY TREATMENT HISTORY: 08/24/2022 screening mammogram = right superior breast mass and abnormal axillary lymph node  09/03/2022 additional view mammogram + right breast ultrasound = large irregular mass in the superior right breast with abnormal axillary lymph nodes 09/10/2022 biopsies: Right breast = invasive ductal carcinoma, grade 2, ER 100%, PR 100%, HER2 1+ Right axillary lymph node = metastatic mammary carcinoma 09/23/2022 CT CAP = infiltrating right breast mass measuring 4.0 x 3.9 cm, right axillary adenopathy, hepatomegaly, hepatic steatosis, no evidence of metastatic disease 09/23/2022 bone scan = mild increase in radiotracer uptake in the posterior aspect of S1 possibly degenerative in nature but attention to follow-up is recommended 10/19/2022 started neoadjuvant chemotherapy dose dense AC followed by  weekly Taxol  04/15/2023, she had right breast lumpectomy and this showed invasive residual adenocarcinoma 44 mm grade 1, extensive DCIS, margins negative, anterior and medial margin less than  1 mm, final pathologic staging pT2 N2a RCB 3, 5 out of 9 lymph nodes with metastatic carcinoma, no evidence of extracapsular extension.  Given the poor response with neoadjuvant chemotherapy, she wanted to seek a second opinion about adjuvant care and hence referred to [facility] health.  Discussed the use of AI scribe software for clinical note transcription with the patient, who gave verbal consent to proceed.  History of Present Illness    Terry Morgan is a 43 year old female with breast cancer who presents for a follow-up consultation regarding her treatment plan.  In May 2024, she was diagnosed with right-sided breast cancer after a screening mammogram identified a mass in the superior area of the right breast with lymph node involvement. Further imaging, including mammograms and ultrasounds, confirmed the mass. A CT scan of the chest, abdomen, and pelvis showed no evidence of disease outside the breast and lymph nodes, while a bone scan indicated a small uptake in the sacrum, likely degenerative. She began chemotherapy in July 2024 with Adriamycin, cyclophosphamide, and paclitaxel, but the tumor did not shrink significantly. Surgery in December 2024 removed a 4.4 cm residual tumor and nine lymph nodes, five of which were cancerous, without extracapsular extension.  She experienced significant side effects from chemotherapy, including hair loss, severe illness, neuropathy and nail damage.  She is standby upset and frustrated that despite these side effects, the chemotherapy did not result in significant tumor shrinkage.  Her family history includes a father with prostate cancer and a history of testicular cancer in the family. She has a BRCA2 gene mutation, which increases her risk for breast and ovarian  cancer.  She has a 50 year old son, is not currently working, and has stopped smoking but occasionally uses hookah. She is currently taking atorvastatin  for cholesterol, Invokana for diabetes, and Xarelto  for a blood clot.  Rest of the pertinent 10 point ROS reviewed and negative   MEDICAL HISTORY:  Past Medical History:  Diagnosis Date   Blood transfusion without reported diagnosis    CAD (coronary artery disease)    GERD (gastroesophageal reflux disease)    Menorrhagia    Nausea    NSTEMI (non-ST elevated myocardial infarction) (HCC)    a. NSTEMI 08/2014 due to acute occlusion of the RCA s/p aspiration thrombectomy and overlapping DES - EF 50-55%.   Obesity    Thrombocytosis 09/11/2014   Tobacco abuse     SURGICAL HISTORY: Past Surgical History:  Procedure Laterality Date   CARDIAC CATHETERIZATION N/A 09/12/2014   Procedure: Left Heart Cath and Coronary Angiography;  Surgeon: Ozell Fell, MD;  Location[facility] INVASIVE CV LAB;  Service: Cardiovascular;  Laterality: N/A;   CARDIAC CATHETERIZATION N/A 09/12/2014   Procedure: Coronary Stent Intervention;  Surgeon: Ozell Fell, MD;  Location: [facility] INVASIVE CV LAB;  Service: Cardiovascular;  Laterality: N/A;  aspiration thrombectomy   CESAREAN SECTION      SOCIAL HISTORY: Social History   Socioeconomic History   Marital status: Single    Spouse name: Not on file   Number of children: Not on file   Years of education: Not on file   Highest education level: Not on file  Occupational History   Not on file  Tobacco Use   Smoking status: Former    Current packs/day: 0.00    Types: Cigarettes   Smokeless tobacco: Never  Substance and Sexual Activity   Alcohol use: No    Alcohol/week: 0.0 standard drinks of alcohol   Drug use: No  Sexual activity: Not Currently  Other Topics Concern   Not on file  Social History Narrative   Not on file   Social Drivers of Health   Financial Resource Strain: Low Risk  (04/25/2023)    Received from [facility]   Overall Financial Resource Strain (CARDIA)    Difficulty of Paying Living Expenses: Not hard at all  Food Insecurity: No Food Insecurity (04/25/2023)   Received from [facility]   Hunger Vital Sign    Worried About Running Out of Food in the Last Year: Never true    Ran Out of Food in the Last Year: Never true  Transportation Needs: No Transportation Needs (04/25/2023)   Received from [facility] - Transportation    Lack of Transportation (Medical): No    Lack of Transportation (Non-Medical): No  Physical Activity: Insufficiently Active (10/27/2022)   Received from [facility]   Exercise Vital Sign    Days of Exercise per Week: 2 days    Minutes of Exercise per Session: 20 min  Stress: No Stress Concern Present (04/15/2023)   Received from [facility] - Occupational Stress Questionnaire    Feeling of Stress : Not at all  Social Connections: Socially Integrated (10/27/2022)   Received from [facility]   Social Network    How would you rate your social network (family, work, friends)?: Good participation with social networks  Intimate Partner Violence: Not At Risk (04/15/2023)   Received from [facility]   HITS    Over the last 12 months how often did your partner physically hurt you?: Never    Over the last 12 months how often did your partner insult you or talk down to you?: Never    Over the last 12 months how often did your partner threaten you with physical harm?: Never    Over the last 12 months how often did your partner scream or curse at you?: Never    FAMILY HISTORY: Family History  Problem Relation Age of Onset   Hypertension Mother    Hyperlipidemia Father    Prostate cancer Father    Stroke Sister 69   Healthy Sister    Healthy Sister    Healthy Brother    Healthy Brother    Healthy Brother    Healthy Brother    Healthy Brother    Asthma Brother    Asthma Brother     Heart disease Paternal Grandmother    Heart murmur Cousin     ALLERGIES:  is allergic to amoxicillin and hydrocortisone.  MEDICATIONS:  Current Outpatient Medications  Medication Sig Dispense Refill   atorvastatin  (LIPITOR ) 10 MG tablet Take 10 mg by mouth daily.     azithromycin  (ZITHROMAX ) 250 MG tablet Take 1 tablet (250 mg total) by mouth daily. Take first 2 tablets together, then 1 every day until finished. 6 tablet 0   canagliflozin (INVOKANA) 300 MG TABS tablet Take 300 mg by mouth daily before breakfast.     ferrous gluconate (FERGON) 324 MG tablet Take 324 mg by mouth daily with breakfast.     fluticasone  (FLONASE ) 50 MCG/ACT nasal spray Place 1 spray into both nostrils daily. 16 g 0   RIVAROXABAN  (XARELTO ) VTE STARTER PACK (15 & 20 MG) Follow package directions: Take one 15mg  tablet by mouth twice a day. On day 22, switch to one 20mg  tablet once a day. Take with food.. 51 each 0   No current facility-administered  medications for this visit.     PHYSICAL EXAMINATION: ECOG PERFORMANCE STATUS: 0 - Asymptomatic  Vitals:   05/25/23 1102  BP: (!) 147/75  Pulse: 70  Resp: 16  Temp: 98.3 F (36.8 C)  SpO2: 98%   Filed Weights   05/25/23 1102  Weight: 244 lb 11.2 oz (111 kg)    GENERAL:alert, no distress and comfortable Physical exam deferred in lieu of counseling  LABORATORY DATA:  I have reviewed the data as listed Lab Results  Component Value Date   WBC 4.4 04/01/2023   HGB 13.1 04/01/2023   HCT 41.0 04/01/2023   MCV 83.5 04/01/2023   PLT 199 04/01/2023     Chemistry      Component Value Date/Time   NA 133 (L) 04/01/2023 1035   K 3.6 04/01/2023 1035   CL 102 04/01/2023 1035   CO2 25 04/01/2023 1035   BUN 9 04/01/2023 1035   CREATININE 0.61 04/01/2023 1035   CREATININE 0.79 02/19/2015 0825      Component Value Date/Time   CALCIUM  8.7 (L) 04/01/2023 1035   ALKPHOS 56 02/19/2015 0825   AST 23 02/19/2015 0825   ALT 19 02/19/2015 0825   BILITOT 0.4  02/19/2015 0825       RADIOGRAPHIC STUDIES: I have personally reviewed the radiological images as listed and agreed with the findings in the report. MM Outside Films Mammo Result Date: 05/18/2023 This examination belongs to an outside facility and is stored here for comparison purposes only.  Contact the originating outside institution for any associated report or interpretation.  MM Outside Films Mammo Result Date: 05/18/2023 This examination belongs to an outside facility and is stored here for comparison purposes only.  Contact the originating outside institution for any associated report or interpretation.   All questions were answered. The patient knows to call the clinic with any problems, questions or concerns. I spent 60 minutes in the care of this patient including H and P, review of records, counseling and coordination of care.     Amber Stalls, MD 05/25/2023 12:37 PM

## 2023-05-26 ENCOUNTER — Encounter: Payer: Self-pay | Admitting: *Deleted

## 2023-05-26 ENCOUNTER — Telehealth: Payer: Self-pay | Admitting: Hematology and Oncology

## 2023-05-26 NOTE — Telephone Encounter (Signed)
 Spoke with patient confirming upcoming appointment

## 2023-05-30 ENCOUNTER — Other Ambulatory Visit: Payer: Self-pay

## 2023-05-30 ENCOUNTER — Ambulatory Visit
Admission: RE | Admit: 2023-05-30 | Discharge: 2023-05-30 | Disposition: A | Discharge status: Home / Self Care | Payer: Medicaid Other | Source: Ambulatory Visit | Attending: Radiation Oncology | Admitting: Radiation Oncology

## 2023-05-30 DIAGNOSIS — C50411 Malignant neoplasm of upper-outer quadrant of right female breast: Secondary | ICD-10-CM | POA: Diagnosis not present

## 2023-05-30 DIAGNOSIS — Z170 Estrogen receptor positive status [ER+]: Secondary | ICD-10-CM

## 2023-05-30 LAB — RAD ONC ARIA SESSION SUMMARY

## 2023-05-30 MED ORDER — RADIAPLEXRX EX GEL
Freq: Once | CUTANEOUS | Status: AC
Start: 2023-05-30 — End: 2023-05-30

## 2023-05-30 MED ORDER — ALRA NON-METALLIC DEODORANT (RAD-ONC)
1.0000 | Freq: Once | TOPICAL | Status: AC
  Administered 2023-05-30: 1 via TOPICAL

## 2023-05-31 ENCOUNTER — Ambulatory Visit
Admission: RE | Admit: 2023-05-31 | Discharge: 2023-05-31 | Disposition: A | Discharge status: Home / Self Care | Payer: Medicaid Other | Source: Ambulatory Visit | Attending: Radiation Oncology | Admitting: Radiation Oncology

## 2023-05-31 ENCOUNTER — Other Ambulatory Visit: Payer: Self-pay

## 2023-05-31 DIAGNOSIS — C50411 Malignant neoplasm of upper-outer quadrant of right female breast: Secondary | ICD-10-CM | POA: Diagnosis not present

## 2023-05-31 LAB — RAD ONC ARIA SESSION SUMMARY

## 2023-06-01 ENCOUNTER — Telehealth: Payer: Self-pay | Admitting: Hematology and Oncology

## 2023-06-01 ENCOUNTER — Other Ambulatory Visit: Payer: Self-pay

## 2023-06-01 ENCOUNTER — Ambulatory Visit
Admission: RE | Admit: 2023-06-01 | Discharge: 2023-06-01 | Disposition: A | Discharge status: Home / Self Care | Payer: Medicaid Other | Source: Ambulatory Visit | Attending: Radiation Oncology | Admitting: Radiation Oncology

## 2023-06-01 ENCOUNTER — Ambulatory Visit: Payer: Medicaid Other

## 2023-06-01 DIAGNOSIS — C50411 Malignant neoplasm of upper-outer quadrant of right female breast: Secondary | ICD-10-CM | POA: Diagnosis not present

## 2023-06-01 LAB — RAD ONC ARIA SESSION SUMMARY

## 2023-06-01 NOTE — Telephone Encounter (Signed)
Scheduled appointment per 2/6 scheduling message. Patient is aware of the scheduled appointment and is active on MyChart.

## 2023-06-02 ENCOUNTER — Other Ambulatory Visit: Payer: Self-pay

## 2023-06-02 ENCOUNTER — Ambulatory Visit
Admission: RE | Admit: 2023-06-02 | Discharge: 2023-06-02 | Disposition: A | Discharge status: Home / Self Care | Payer: Medicaid Other | Source: Ambulatory Visit | Attending: Radiation Oncology

## 2023-06-02 ENCOUNTER — Ambulatory Visit: Payer: Medicaid Other

## 2023-06-02 DIAGNOSIS — C50411 Malignant neoplasm of upper-outer quadrant of right female breast: Secondary | ICD-10-CM | POA: Diagnosis not present

## 2023-06-02 LAB — RAD ONC ARIA SESSION SUMMARY

## 2023-06-03 ENCOUNTER — Ambulatory Visit
Admission: RE | Admit: 2023-06-03 | Discharge: 2023-06-03 | Disposition: A | Discharge status: Home / Self Care | Payer: Medicaid Other | Source: Ambulatory Visit | Attending: Radiation Oncology | Admitting: Radiation Oncology

## 2023-06-03 ENCOUNTER — Other Ambulatory Visit: Payer: Self-pay

## 2023-06-03 DIAGNOSIS — C50411 Malignant neoplasm of upper-outer quadrant of right female breast: Secondary | ICD-10-CM | POA: Diagnosis not present

## 2023-06-03 LAB — RAD ONC ARIA SESSION SUMMARY
Course Elapsed Days: 4
Plan Fractions Treated to Date: 5
Plan Fractions Treated to Date: 5
Plan Prescribed Dose Per Fraction: 1.8 Gy
Plan Prescribed Dose Per Fraction: 1.8 Gy
Plan Total Fractions Prescribed: 28
Plan Total Fractions Prescribed: 28
Plan Total Prescribed Dose: 50.4 Gy
Plan Total Prescribed Dose: 50.4 Gy
Reference Point Dosage Given to Date: 9 Gy
Reference Point Dosage Given to Date: 9 Gy
Reference Point Session Dosage Given: 1.8 Gy
Reference Point Session Dosage Given: 1.8 Gy
Session Number: 5

## 2023-06-06 ENCOUNTER — Ambulatory Visit
Admission: RE | Admit: 2023-06-06 | Discharge: 2023-06-06 | Disposition: A | Discharge status: Home / Self Care | Payer: Medicaid Other | Source: Ambulatory Visit | Attending: Radiation Oncology

## 2023-06-06 ENCOUNTER — Ambulatory Visit: Payer: Medicaid Other

## 2023-06-06 ENCOUNTER — Other Ambulatory Visit: Payer: Self-pay

## 2023-06-06 DIAGNOSIS — C50411 Malignant neoplasm of upper-outer quadrant of right female breast: Secondary | ICD-10-CM | POA: Diagnosis not present

## 2023-06-06 LAB — RAD ONC ARIA SESSION SUMMARY

## 2023-06-07 ENCOUNTER — Ambulatory Visit: Payer: Medicaid Other

## 2023-06-07 ENCOUNTER — Ambulatory Visit
Admission: RE | Admit: 2023-06-07 | Discharge: 2023-06-07 | Disposition: A | Discharge status: Home / Self Care | Payer: Medicaid Other | Source: Ambulatory Visit | Attending: Radiation Oncology

## 2023-06-07 ENCOUNTER — Other Ambulatory Visit: Payer: Self-pay

## 2023-06-07 DIAGNOSIS — C50411 Malignant neoplasm of upper-outer quadrant of right female breast: Secondary | ICD-10-CM | POA: Diagnosis not present

## 2023-06-07 LAB — RAD ONC ARIA SESSION SUMMARY

## 2023-06-08 ENCOUNTER — Ambulatory Visit
Admission: RE | Admit: 2023-06-08 | Discharge: 2023-06-08 | Discharge status: Home / Self Care | Payer: Medicaid Other | Source: Ambulatory Visit | Attending: Radiation Oncology

## 2023-06-08 ENCOUNTER — Ambulatory Visit: Payer: Medicaid Other

## 2023-06-08 ENCOUNTER — Other Ambulatory Visit: Payer: Self-pay

## 2023-06-08 DIAGNOSIS — C50411 Malignant neoplasm of upper-outer quadrant of right female breast: Secondary | ICD-10-CM | POA: Diagnosis not present

## 2023-06-08 LAB — RAD ONC ARIA SESSION SUMMARY

## 2023-06-09 ENCOUNTER — Ambulatory Visit
Admission: RE | Admit: 2023-06-09 | Discharge: 2023-06-09 | Discharge status: Home / Self Care | Payer: Medicaid Other | Source: Ambulatory Visit | Attending: Radiation Oncology

## 2023-06-09 ENCOUNTER — Ambulatory Visit: Payer: Medicaid Other | Admitting: Radiation Oncology

## 2023-06-09 ENCOUNTER — Ambulatory Visit: Payer: Medicaid Other

## 2023-06-09 ENCOUNTER — Other Ambulatory Visit: Payer: Self-pay

## 2023-06-09 DIAGNOSIS — C50411 Malignant neoplasm of upper-outer quadrant of right female breast: Secondary | ICD-10-CM | POA: Diagnosis not present

## 2023-06-09 LAB — RAD ONC ARIA SESSION SUMMARY

## 2023-06-10 ENCOUNTER — Ambulatory Visit: Payer: Medicaid Other | Admitting: Radiation Oncology

## 2023-06-10 ENCOUNTER — Ambulatory Visit: Payer: Medicaid Other

## 2023-06-13 ENCOUNTER — Ambulatory Visit: Payer: Medicaid Other

## 2023-06-13 ENCOUNTER — Ambulatory Visit: Payer: Medicaid Other | Admitting: Radiation Oncology

## 2023-06-13 ENCOUNTER — Other Ambulatory Visit: Payer: Self-pay

## 2023-06-13 ENCOUNTER — Ambulatory Visit
Admission: RE | Admit: 2023-06-13 | Discharge: 2023-06-13 | Disposition: A | Discharge status: Home / Self Care | Payer: Medicaid Other | Source: Ambulatory Visit | Attending: Radiation Oncology | Admitting: Radiation Oncology

## 2023-06-13 DIAGNOSIS — C50411 Malignant neoplasm of upper-outer quadrant of right female breast: Secondary | ICD-10-CM | POA: Diagnosis not present

## 2023-06-13 LAB — RAD ONC ARIA SESSION SUMMARY
Course Elapsed Days: 14
Plan Fractions Treated to Date: 10
Plan Fractions Treated to Date: 10
Plan Prescribed Dose Per Fraction: 1.8 Gy
Plan Prescribed Dose Per Fraction: 1.8 Gy
Plan Total Fractions Prescribed: 28
Plan Total Fractions Prescribed: 28
Plan Total Prescribed Dose: 50.4 Gy
Plan Total Prescribed Dose: 50.4 Gy
Reference Point Dosage Given to Date: 18 Gy
Reference Point Dosage Given to Date: 18 Gy
Reference Point Session Dosage Given: 1.8 Gy
Reference Point Session Dosage Given: 1.8 Gy
Session Number: 10

## 2023-06-14 ENCOUNTER — Ambulatory Visit: Payer: Medicaid Other | Admitting: Radiation Oncology

## 2023-06-14 ENCOUNTER — Ambulatory Visit
Admission: RE | Admit: 2023-06-14 | Discharge: 2023-06-14 | Disposition: A | Discharge status: Home / Self Care | Payer: Medicaid Other | Source: Ambulatory Visit | Attending: Radiation Oncology

## 2023-06-14 ENCOUNTER — Other Ambulatory Visit: Payer: Self-pay

## 2023-06-14 DIAGNOSIS — C50411 Malignant neoplasm of upper-outer quadrant of right female breast: Secondary | ICD-10-CM | POA: Diagnosis not present

## 2023-06-14 LAB — RAD ONC ARIA SESSION SUMMARY

## 2023-06-15 ENCOUNTER — Other Ambulatory Visit: Payer: Self-pay

## 2023-06-15 ENCOUNTER — Ambulatory Visit
Admission: RE | Admit: 2023-06-15 | Discharge: 2023-06-15 | Disposition: A | Discharge status: Home / Self Care | Payer: Medicaid Other | Source: Ambulatory Visit | Attending: Radiation Oncology

## 2023-06-15 ENCOUNTER — Ambulatory Visit: Payer: Medicaid Other

## 2023-06-15 DIAGNOSIS — C50411 Malignant neoplasm of upper-outer quadrant of right female breast: Secondary | ICD-10-CM | POA: Diagnosis not present

## 2023-06-15 LAB — RAD ONC ARIA SESSION SUMMARY
Course Elapsed Days: 16
Plan Fractions Treated to Date: 12
Plan Fractions Treated to Date: 12
Plan Prescribed Dose Per Fraction: 1.8 Gy
Plan Prescribed Dose Per Fraction: 1.8 Gy
Plan Total Fractions Prescribed: 28
Plan Total Fractions Prescribed: 28
Plan Total Prescribed Dose: 50.4 Gy
Plan Total Prescribed Dose: 50.4 Gy
Reference Point Dosage Given to Date: 21.6 Gy
Reference Point Dosage Given to Date: 21.6 Gy
Reference Point Session Dosage Given: 1.8 Gy
Reference Point Session Dosage Given: 1.8 Gy
Session Number: 12

## 2023-06-16 ENCOUNTER — Ambulatory Visit: Payer: Medicaid Other | Admitting: Radiation Oncology

## 2023-06-16 ENCOUNTER — Other Ambulatory Visit: Payer: Self-pay

## 2023-06-16 ENCOUNTER — Ambulatory Visit
Admission: RE | Admit: 2023-06-16 | Discharge: 2023-06-16 | Disposition: A | Discharge status: Home / Self Care | Payer: Medicaid Other | Source: Ambulatory Visit | Attending: Radiation Oncology | Admitting: Radiation Oncology

## 2023-06-16 DIAGNOSIS — C50411 Malignant neoplasm of upper-outer quadrant of right female breast: Secondary | ICD-10-CM | POA: Diagnosis not present

## 2023-06-16 LAB — RAD ONC ARIA SESSION SUMMARY
Course Elapsed Days: 17
Plan Fractions Treated to Date: 13
Plan Fractions Treated to Date: 13
Plan Prescribed Dose Per Fraction: 1.8 Gy
Plan Prescribed Dose Per Fraction: 1.8 Gy
Plan Total Fractions Prescribed: 28
Plan Total Fractions Prescribed: 28
Plan Total Prescribed Dose: 50.4 Gy
Plan Total Prescribed Dose: 50.4 Gy
Reference Point Dosage Given to Date: 23.4 Gy
Reference Point Dosage Given to Date: 23.4 Gy
Reference Point Session Dosage Given: 1.8 Gy
Reference Point Session Dosage Given: 1.8 Gy
Session Number: 13

## 2023-06-17 ENCOUNTER — Ambulatory Visit: Payer: Medicaid Other | Admitting: Radiation Oncology

## 2023-06-17 ENCOUNTER — Inpatient Hospital Stay: Payer: Medicaid Other | Admitting: Hematology and Oncology

## 2023-06-17 ENCOUNTER — Ambulatory Visit
Admission: RE | Admit: 2023-06-17 | Discharge: 2023-06-17 | Disposition: A | Discharge status: Home / Self Care | Payer: Medicaid Other | Source: Ambulatory Visit | Attending: Radiation Oncology | Admitting: Radiation Oncology

## 2023-06-17 ENCOUNTER — Other Ambulatory Visit: Payer: Self-pay

## 2023-06-17 DIAGNOSIS — C50411 Malignant neoplasm of upper-outer quadrant of right female breast: Secondary | ICD-10-CM

## 2023-06-17 DIAGNOSIS — Z170 Estrogen receptor positive status [ER+]: Secondary | ICD-10-CM | POA: Diagnosis not present

## 2023-06-17 LAB — RAD ONC ARIA SESSION SUMMARY
Course Elapsed Days: 18
Plan Fractions Treated to Date: 14
Plan Fractions Treated to Date: 14
Plan Prescribed Dose Per Fraction: 1.8 Gy
Plan Prescribed Dose Per Fraction: 1.8 Gy
Plan Total Fractions Prescribed: 28
Plan Total Fractions Prescribed: 28
Plan Total Prescribed Dose: 50.4 Gy
Plan Total Prescribed Dose: 50.4 Gy
Reference Point Dosage Given to Date: 25.2 Gy
Reference Point Dosage Given to Date: 25.2 Gy
Reference Point Session Dosage Given: 1.8 Gy
Reference Point Session Dosage Given: 1.8 Gy
Session Number: 14

## 2023-06-17 NOTE — Assessment & Plan Note (Signed)
 This is a very pleasant 43 year old premenopausal female patient initially diagnosed with invasive ductal carcinoma, grade 2 of the right breast back in May 2024 treated at [facility] transferring her care to [facility] health.  Right Breast Cancer Diagnosed in May 2024 with a 4.4 cm tumor and 5/9 lymph nodes positive. No extracapsular extension. Completed neoadjuvant chemotherapy with Adriamycin, cyclophosphamide, and paclitaxel with residual disease RCB III at surgery. Strongly estrogen and progesterone receptor positive. No further chemotherapy recommended at this time. -She is now undergoing adjuvant radiation.  Postradiation we will proceed aromatase inhibitor with ovarian function suppression as well as Verzenio versus tamoxifen with ovarian function suppression adenocarcinoma.  At this time given the poor response she had with neoadjuvant chemo, she is understandably questioning the treatment and is wanting to proceed with holistic methods. I strongly encouraged her to come back and see me and happy to refer her to an integrative medicine clinic if needed along with conventional treatment.  She will be seeing her ultrasound to discuss this.  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 No immediate plans for bilateral mastectomy.  -We once again discussed about intensive screening if she is not to undergo bilateral mastectomy.  I would like to see her for intensive surveillance.  She will also need a referral to gynecological oncology with consideration of bilateral oophorectomy and she is open to discussing more about this.  General Health Maintenance -Consider Guardant Reveal blood test for tumor DNA detection after completion of adjuvant radiation -Continue monitoring for side effects of anti-estrogen therapy including hot flashes, vaginal dryness, mood swings, and potential bone density loss. -Follow-up in first or second week of April to discuss further treatment options.  I  connected with  Terry Morgan on 06/17/23 by a telephone application and verified that I am speaking with the correct person using two identifiers.   I discussed the limitations of evaluation and management by telemedicine. The patient expressed understanding and agreed to proceed.  Location of provider: office Location of patient: Home.

## 2023-06-17 NOTE — Progress Notes (Signed)
 [facility] CONSULT NOTE  Patient Care Team: Associates, [facility] as PCP - General (Family Medicine) Pershing Proud, RN as Oncology Nurse Navigator Donnelly Angelica, RN as Oncology Nurse Navigator Rachel Moulds, MD as Consulting Physician (Hematology and Oncology) Lonie Peak, MD as Attending Physician (Radiation Oncology)  CHIEF COMPLAINTS/PURPOSE OF CONSULTATION:  IDC right breast, ER positive  ASSESSMENT & PLAN:  Breast cancer of upper-outer quadrant of right female breast [facility]) This is a very pleasant 43 year old premenopausal female patient initially diagnosed with invasive ductal carcinoma, grade 2 of the right breast back in May 2024 treated at [facility] transferring her care to [facility] health.  Right Breast Cancer Diagnosed in May 2024 with a 4.4 cm tumor and 5/9 lymph nodes positive. No extracapsular extension. Completed neoadjuvant chemotherapy with Adriamycin, cyclophosphamide, and paclitaxel with residual disease RCB III at surgery. Strongly estrogen and progesterone receptor positive. No further chemotherapy recommended at this time. -She is now undergoing adjuvant radiation.  Postradiation we will proceed aromatase inhibitor with ovarian function suppression as well as Verzenio versus tamoxifen with ovarian function suppression adenocarcinoma.  At this time given the poor response she had with neoadjuvant chemo, she is understandably questioning the treatment and is wanting to proceed with holistic methods. I strongly encouraged her to come back and see me and happy to refer her to an integrative medicine clinic if needed along with conventional treatment.  She will be seeing her ultrasound to discuss this.  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 No immediate plans for bilateral mastectomy.  -We once again discussed about intensive screening if she is not to undergo bilateral mastectomy.  I would like to see her for  intensive surveillance.  She will also need a referral to gynecological oncology with consideration of bilateral oophorectomy and she is open to discussing more about this.  General Health Maintenance -Consider Guardant Reveal blood test for tumor DNA detection after completion of adjuvant radiation -Continue monitoring for side effects of anti-estrogen therapy including hot flashes, vaginal dryness, mood swings, and potential bone density loss. -Follow-up in first or second week of April to discuss further treatment options.  I connected with  Dejanira Pamintuan Damas on 06/17/23 by a telephone application and verified that I am speaking with the correct person using two identifiers.   I discussed the limitations of evaluation and management by telemedicine. The patient expressed understanding and agreed to proceed.  Location of provider: office Location of patient: Home.   No orders of the defined types were placed in this encounter.    HISTORY OF PRESENTING ILLNESS:  Terry Morgan 43 y.o. female is here because of new diagnosis of breast cancer.  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, grade 2, ER 100%, PR 100%, HER2 1+ 09/20/2022 genetic testing: 34 Ambry gene panel positive: pathogenic BRCA2 mutation:   ONCOLOGY TREATMENT HISTORY: 08/24/2022 screening mammogram = right superior breast mass and abnormal axillary lymph node  09/03/2022 additional view mammogram + right breast ultrasound = large irregular mass in the superior right breast with abnormal axillary lymph nodes 09/10/2022 biopsies: Right breast = invasive ductal carcinoma, grade 2, ER 100%, PR 100%, HER2 1+ Right axillary lymph node = metastatic mammary carcinoma 09/23/2022 CT CAP = infiltrating right breast mass measuring 4.0 x 3.9 cm, right axillary adenopathy, hepatomegaly, hepatic steatosis, no evidence of metastatic disease 09/23/2022 bone scan = mild increase in radiotracer uptake in the  posterior aspect of S1  possibly degenerative in nature but attention to follow-up is recommended 10/19/2022 started neoadjuvant chemotherapy dose dense AC followed by weekly Taxol  04/15/2023, she had right breast lumpectomy and this showed invasive residual adenocarcinoma 44 mm grade 1, extensive DCIS, margins negative, anterior and medial margin less than 1 mm, final pathologic staging pT2 N2a RCB 3, 5 out of 9 lymph nodes with metastatic carcinoma, no evidence of extracapsular extension.  Given the poor response with neoadjuvant chemotherapy, she wanted to seek a second opinion about adjuvant care and hence referred to [facility] health.  Discussed the use of AI scribe software for clinical note transcription with the patient, who gave verbal consent to proceed.  History of Present Illness    [name] is a 43 year old female with breast cancer who presents for a follow-up telephone visit to discuss the treatment plan.  Since her last visit here she is now undergoing radiation anticipates radiation to be completed end of March so far radiation has been doing well.  Postradiation treatment.  Previously discussed aromatase inhibitor with ovarian suppression Verzenio however she says she wants to really think about this.  She is considering holistic modalities of treatment she is however open to coming back to discuss this further   MEDICAL HISTORY:  Past Medical History:  Diagnosis Date   Blood transfusion without reported diagnosis    CAD (coronary artery disease)    GERD (gastroesophageal reflux disease)    Menorrhagia    Nausea    NSTEMI (non-ST elevated myocardial infarction) (HCC)    a. NSTEMI 08/2014 due to acute occlusion of the RCA s/p aspiration thrombectomy and overlapping DES - EF 50-55%.   Obesity    Thrombocytosis 09/11/2014   Tobacco abuse     SURGICAL HISTORY: Past Surgical History:  Procedure Laterality Date   CARDIAC CATHETERIZATION N/A 09/12/2014   Procedure: Left  Heart Cath and Coronary Angiography;  Surgeon: Tonny Bollman, MD;  [facility] INVASIVE CV LAB;  Service: Cardiovascular;  Laterality: N/A;   CARDIAC CATHETERIZATION N/A 09/12/2014   Procedure: Coronary Stent Intervention;  Surgeon: Tonny Bollman, MD;  Location: [facility] INVASIVE CV LAB;  Service: Cardiovascular;  Laterality: N/A;  aspiration thrombectomy   CESAREAN SECTION      SOCIAL HISTORY: Social History   Socioeconomic History   Marital status: Single    Spouse name: Not on file   Number of children: Not on file   Years of education: Not on file   Highest education level: Not on file  Occupational History   Not on file  Tobacco Use   Smoking status: Former    Current packs/day: 0.00    Types: Cigarettes   Smokeless tobacco: Never  Substance and Sexual Activity   Alcohol use: No    Alcohol/week: 0.0 standard drinks of alcohol   Drug use: No   Sexual activity: Not Currently  Other Topics Concern   Not on file  Social History Narrative   Not on file   Social Drivers of Health   Financial Resource Strain: Low Risk  (04/25/2023)   Received from [facility]   Overall Financial Resource Strain (CARDIA)    Difficulty of Paying Living Expenses: Not hard at all  Food Insecurity: No Food Insecurity (04/25/2023)   Received from [facility]   Hunger Vital Sign    Worried About Running Out of Food in the Last Year: Never true    Ran Out of Food in the Last Year: Never true  Transportation Needs: No  Transportation Needs (04/25/2023)   Received from [facility] - Transportation    Lack of Transportation (Medical): No    Lack of Transportation (Non-Medical): No  Physical Activity: Insufficiently Active (10/27/2022)   Received from [facility]   Exercise Vital Sign    Days of Exercise per Week: 2 days    Minutes of Exercise per Session: 20 min  Stress: No Stress Concern Present (04/15/2023)   Received from [facility] -  Occupational Stress Questionnaire    Feeling of Stress : Not at all  Social Connections: Socially Integrated (10/27/2022)   Received from [facility]   Social Network    How would you rate your social network (family, work, friends)?: Good participation with social networks  Intimate Partner Violence: Not At Risk (04/15/2023)   Received from [facility]   HITS    Over the last 12 months how often did your partner physically hurt you?: Never    Over the last 12 months how often did your partner insult you or talk down to you?: Never    Over the last 12 months how often did your partner threaten you with physical harm?: Never    Over the last 12 months how often did your partner scream or curse at you?: Never    FAMILY HISTORY: Family History  Problem Relation Age of Onset   Hypertension Mother    Hyperlipidemia Father    Prostate cancer Father    Stroke Sister 33   Healthy Sister    Healthy Sister    Healthy Brother    Healthy Brother    Healthy Brother    Healthy Brother    Healthy Brother    Asthma Brother    Asthma Brother    Heart disease Paternal Grandmother    Heart murmur Cousin     ALLERGIES:  is allergic to amoxicillin and hydrocortisone.  MEDICATIONS:  Current Outpatient Medications  Medication Sig Dispense Refill   atorvastatin (LIPITOR) 10 MG tablet Take 10 mg by mouth daily.     azithromycin (ZITHROMAX) 250 MG tablet Take 1 tablet (250 mg total) by mouth daily. Take first 2 tablets together, then 1 every day until finished. 6 tablet 0   canagliflozin (INVOKANA) 300 MG TABS tablet Take 300 mg by mouth daily before breakfast.     ferrous gluconate (FERGON) 324 MG tablet Take 324 mg by mouth daily with breakfast.     fluticasone (FLONASE) 50 MCG/ACT nasal spray Place 1 spray into both nostrils daily. 16 g 0   RIVAROXABAN (XARELTO) VTE STARTER PACK (15 & 20 MG) Follow package directions: Take one 15mg  tablet by mouth twice a day. On day 22, switch to one 20mg   tablet once a day. Take with food.. 51 each 0   No current facility-administered medications for this visit.     PHYSICAL EXAMINATION: ECOG PERFORMANCE STATUS: 0 - Asymptomatic  There were no vitals filed for this visit.  There were no vitals filed for this visit.  Telephone visit  LABORATORY DATA:  I have reviewed the data as listed Lab Results  Component Value Date   WBC 4.4 04/01/2023   HGB 13.1 04/01/2023   HCT 41.0 04/01/2023   MCV 83.5 04/01/2023   PLT 199 04/01/2023     Chemistry      Component Value Date/Time   NA 133 (L) 04/01/2023 1035   K 3.6 04/01/2023 1035   CL 102 04/01/2023 1035  CO2 25 04/01/2023 1035   BUN 9 04/01/2023 1035   CREATININE 0.61 04/01/2023 1035   CREATININE 0.79 02/19/2015 0825      Component Value Date/Time   CALCIUM 8.7 (L) 04/01/2023 1035   ALKPHOS 56 02/19/2015 0825   AST 23 02/19/2015 0825   ALT 19 02/19/2015 0825   BILITOT 0.4 02/19/2015 0825       RADIOGRAPHIC STUDIES: I have personally reviewed the radiological images as listed and agreed with the findings in the report. No results found.   All questions were answered. The patient knows to call the clinic with any problems, questions or concerns.  Time spent: 10 min     Rachel Moulds, MD 06/17/2023 1:46 PM

## 2023-06-20 ENCOUNTER — Other Ambulatory Visit: Payer: Self-pay

## 2023-06-20 ENCOUNTER — Ambulatory Visit: Payer: Medicaid Other

## 2023-06-20 ENCOUNTER — Ambulatory Visit
Admission: RE | Admit: 2023-06-20 | Discharge: 2023-06-20 | Disposition: A | Discharge status: Home / Self Care | Payer: Medicaid Other | Source: Ambulatory Visit | Attending: Radiation Oncology | Admitting: Radiation Oncology

## 2023-06-20 ENCOUNTER — Ambulatory Visit: Payer: Medicaid Other | Admitting: Radiation Oncology

## 2023-06-20 DIAGNOSIS — Z170 Estrogen receptor positive status [ER+]: Secondary | ICD-10-CM | POA: Insufficient documentation

## 2023-06-20 DIAGNOSIS — C50411 Malignant neoplasm of upper-outer quadrant of right female breast: Secondary | ICD-10-CM | POA: Insufficient documentation

## 2023-06-20 LAB — RAD ONC ARIA SESSION SUMMARY

## 2023-06-21 ENCOUNTER — Other Ambulatory Visit: Payer: Self-pay

## 2023-06-21 ENCOUNTER — Ambulatory Visit

## 2023-06-21 ENCOUNTER — Ambulatory Visit
Admission: RE | Admit: 2023-06-21 | Discharge: 2023-06-21 | Disposition: A | Discharge status: Home / Self Care | Payer: Medicaid Other | Source: Ambulatory Visit | Attending: Radiation Oncology

## 2023-06-21 ENCOUNTER — Ambulatory Visit: Payer: Medicaid Other | Admitting: Radiation Oncology

## 2023-06-21 DIAGNOSIS — C50411 Malignant neoplasm of upper-outer quadrant of right female breast: Secondary | ICD-10-CM | POA: Diagnosis not present

## 2023-06-21 LAB — RAD ONC ARIA SESSION SUMMARY
Course Elapsed Days: 22
Plan Fractions Treated to Date: 16
Plan Fractions Treated to Date: 16
Plan Prescribed Dose Per Fraction: 1.8 Gy
Plan Prescribed Dose Per Fraction: 1.8 Gy
Plan Total Fractions Prescribed: 28
Plan Total Fractions Prescribed: 28
Plan Total Prescribed Dose: 50.4 Gy
Plan Total Prescribed Dose: 50.4 Gy
Reference Point Dosage Given to Date: 28.8 Gy
Reference Point Dosage Given to Date: 28.8 Gy
Reference Point Session Dosage Given: 1.8 Gy
Reference Point Session Dosage Given: 1.8 Gy
Session Number: 16

## 2023-06-22 ENCOUNTER — Ambulatory Visit: Payer: Medicaid Other | Admitting: Radiation Oncology

## 2023-06-22 ENCOUNTER — Ambulatory Visit
Admission: RE | Admit: 2023-06-22 | Discharge: 2023-06-22 | Disposition: A | Discharge status: Home / Self Care | Payer: Medicaid Other | Source: Ambulatory Visit | Attending: Radiation Oncology

## 2023-06-22 ENCOUNTER — Other Ambulatory Visit: Payer: Self-pay

## 2023-06-22 ENCOUNTER — Ambulatory Visit: Payer: Medicaid Other

## 2023-06-22 DIAGNOSIS — C50411 Malignant neoplasm of upper-outer quadrant of right female breast: Secondary | ICD-10-CM | POA: Diagnosis not present

## 2023-06-22 LAB — RAD ONC ARIA SESSION SUMMARY
Course Elapsed Days: 23
Plan Fractions Treated to Date: 17
Plan Fractions Treated to Date: 17
Plan Prescribed Dose Per Fraction: 1.8 Gy
Plan Prescribed Dose Per Fraction: 1.8 Gy
Plan Total Fractions Prescribed: 28
Plan Total Fractions Prescribed: 28
Plan Total Prescribed Dose: 50.4 Gy
Plan Total Prescribed Dose: 50.4 Gy
Reference Point Dosage Given to Date: 30.6 Gy
Reference Point Dosage Given to Date: 30.6 Gy
Reference Point Session Dosage Given: 1.8 Gy
Reference Point Session Dosage Given: 1.8 Gy
Session Number: 17

## 2023-06-23 ENCOUNTER — Ambulatory Visit: Payer: Medicaid Other | Admitting: Radiation Oncology

## 2023-06-23 ENCOUNTER — Ambulatory Visit
Admission: RE | Admit: 2023-06-23 | Discharge: 2023-06-23 | Disposition: A | Discharge status: Home / Self Care | Payer: Medicaid Other | Source: Ambulatory Visit | Attending: Radiation Oncology | Admitting: Radiation Oncology

## 2023-06-23 ENCOUNTER — Other Ambulatory Visit: Payer: Self-pay

## 2023-06-23 DIAGNOSIS — C50411 Malignant neoplasm of upper-outer quadrant of right female breast: Secondary | ICD-10-CM | POA: Diagnosis not present

## 2023-06-23 LAB — RAD ONC ARIA SESSION SUMMARY
Course Elapsed Days: 24
Plan Fractions Treated to Date: 18
Plan Fractions Treated to Date: 18
Plan Prescribed Dose Per Fraction: 1.8 Gy
Plan Prescribed Dose Per Fraction: 1.8 Gy
Plan Total Fractions Prescribed: 28
Plan Total Fractions Prescribed: 28
Plan Total Prescribed Dose: 50.4 Gy
Plan Total Prescribed Dose: 50.4 Gy
Reference Point Dosage Given to Date: 32.4 Gy
Reference Point Dosage Given to Date: 32.4 Gy
Reference Point Session Dosage Given: 1.8 Gy
Reference Point Session Dosage Given: 1.8 Gy
Session Number: 18

## 2023-06-24 ENCOUNTER — Ambulatory Visit: Payer: Medicaid Other | Admitting: Radiation Oncology

## 2023-06-24 ENCOUNTER — Ambulatory Visit: Payer: Medicaid Other

## 2023-06-27 ENCOUNTER — Other Ambulatory Visit: Payer: Self-pay

## 2023-06-27 ENCOUNTER — Ambulatory Visit
Admission: RE | Admit: 2023-06-27 | Discharge: 2023-06-27 | Disposition: A | Discharge status: Home / Self Care | Payer: Medicaid Other | Source: Ambulatory Visit | Attending: Radiation Oncology | Admitting: Radiation Oncology

## 2023-06-27 ENCOUNTER — Ambulatory Visit: Payer: Medicaid Other | Admitting: Radiation Oncology

## 2023-06-27 DIAGNOSIS — C50411 Malignant neoplasm of upper-outer quadrant of right female breast: Secondary | ICD-10-CM | POA: Diagnosis not present

## 2023-06-27 LAB — RAD ONC ARIA SESSION SUMMARY
Course Elapsed Days: 28
Plan Fractions Treated to Date: 19
Plan Fractions Treated to Date: 19
Plan Prescribed Dose Per Fraction: 1.8 Gy
Plan Prescribed Dose Per Fraction: 1.8 Gy
Plan Total Fractions Prescribed: 28
Plan Total Fractions Prescribed: 28
Plan Total Prescribed Dose: 50.4 Gy
Plan Total Prescribed Dose: 50.4 Gy
Reference Point Dosage Given to Date: 34.2 Gy
Reference Point Dosage Given to Date: 34.2 Gy
Reference Point Session Dosage Given: 1.8 Gy
Reference Point Session Dosage Given: 1.8 Gy
Session Number: 19

## 2023-06-28 ENCOUNTER — Ambulatory Visit: Payer: Medicaid Other

## 2023-06-28 ENCOUNTER — Ambulatory Visit: Payer: Medicaid Other | Admitting: Radiation Oncology

## 2023-06-29 ENCOUNTER — Ambulatory Visit
Admission: RE | Admit: 2023-06-29 | Discharge: 2023-06-29 | Disposition: A | Discharge status: Home / Self Care | Payer: Medicaid Other | Source: Ambulatory Visit | Attending: Radiation Oncology | Admitting: Radiation Oncology

## 2023-06-29 ENCOUNTER — Other Ambulatory Visit: Payer: Self-pay

## 2023-06-29 ENCOUNTER — Ambulatory Visit: Payer: Medicaid Other | Admitting: Radiation Oncology

## 2023-06-29 ENCOUNTER — Encounter: Payer: Self-pay | Admitting: *Deleted

## 2023-06-29 ENCOUNTER — Encounter: Payer: Self-pay | Admitting: Hematology and Oncology

## 2023-06-29 DIAGNOSIS — C50411 Malignant neoplasm of upper-outer quadrant of right female breast: Secondary | ICD-10-CM | POA: Diagnosis not present

## 2023-06-29 DIAGNOSIS — Z170 Estrogen receptor positive status [ER+]: Secondary | ICD-10-CM

## 2023-06-29 LAB — RAD ONC ARIA SESSION SUMMARY
Course Elapsed Days: 30
Plan Fractions Treated to Date: 20
Plan Fractions Treated to Date: 20
Plan Prescribed Dose Per Fraction: 1.8 Gy
Plan Prescribed Dose Per Fraction: 1.8 Gy
Plan Total Fractions Prescribed: 28
Plan Total Fractions Prescribed: 28
Plan Total Prescribed Dose: 50.4 Gy
Plan Total Prescribed Dose: 50.4 Gy
Reference Point Dosage Given to Date: 36 Gy
Reference Point Dosage Given to Date: 36 Gy
Reference Point Session Dosage Given: 1.8 Gy
Reference Point Session Dosage Given: 1.8 Gy
Session Number: 20

## 2023-06-29 NOTE — Progress Notes (Signed)
 Received referral from patient regarding financial assistance.  Called patient to introduce myself as Dance movement psychotherapist and to offer available resources. Screened for one-time $1000 [employer] grant and patient is able to apply. Advised what is needed to apply and briefly went over expenses and how they are covered. She will bring and present to registration after Radiation to be scanned and emailed to me. She will then be given grant paperwork to complete.  She has my name and contact number for any additional financial questions or concerns.

## 2023-06-30 ENCOUNTER — Inpatient Hospital Stay: Admitting: Licensed Clinical Social Worker

## 2023-06-30 ENCOUNTER — Encounter: Payer: Self-pay | Admitting: Hematology and Oncology

## 2023-06-30 ENCOUNTER — Ambulatory Visit
Admission: RE | Admit: 2023-06-30 | Discharge: 2023-06-30 | Disposition: A | Discharge status: Home / Self Care | Payer: Medicaid Other | Source: Ambulatory Visit | Attending: Radiation Oncology | Admitting: Radiation Oncology

## 2023-06-30 ENCOUNTER — Other Ambulatory Visit: Payer: Self-pay

## 2023-06-30 ENCOUNTER — Ambulatory Visit: Payer: Medicaid Other | Admitting: Radiation Oncology

## 2023-06-30 DIAGNOSIS — Z87891 Personal history of nicotine dependence: Secondary | ICD-10-CM | POA: Insufficient documentation

## 2023-06-30 DIAGNOSIS — Z170 Estrogen receptor positive status [ER+]: Secondary | ICD-10-CM | POA: Insufficient documentation

## 2023-06-30 DIAGNOSIS — Z1501 Genetic susceptibility to malignant neoplasm of breast: Secondary | ICD-10-CM | POA: Insufficient documentation

## 2023-06-30 DIAGNOSIS — C773 Secondary and unspecified malignant neoplasm of axilla and upper limb lymph nodes: Secondary | ICD-10-CM | POA: Insufficient documentation

## 2023-06-30 DIAGNOSIS — Z9221 Personal history of antineoplastic chemotherapy: Secondary | ICD-10-CM | POA: Insufficient documentation

## 2023-06-30 DIAGNOSIS — C50411 Malignant neoplasm of upper-outer quadrant of right female breast: Secondary | ICD-10-CM | POA: Insufficient documentation

## 2023-06-30 DIAGNOSIS — Z923 Personal history of irradiation: Secondary | ICD-10-CM | POA: Insufficient documentation

## 2023-06-30 DIAGNOSIS — Z1731 Human epidermal growth factor receptor 2 positive status: Secondary | ICD-10-CM | POA: Insufficient documentation

## 2023-06-30 DIAGNOSIS — Z1721 Progesterone receptor positive status: Secondary | ICD-10-CM | POA: Insufficient documentation

## 2023-06-30 DIAGNOSIS — Z1509 Genetic susceptibility to other malignant neoplasm: Secondary | ICD-10-CM | POA: Insufficient documentation

## 2023-06-30 LAB — RAD ONC ARIA SESSION SUMMARY
Course Elapsed Days: 31
Plan Fractions Treated to Date: 21
Plan Fractions Treated to Date: 21
Plan Prescribed Dose Per Fraction: 1.8 Gy
Plan Prescribed Dose Per Fraction: 1.8 Gy
Plan Total Fractions Prescribed: 28
Plan Total Fractions Prescribed: 28
Plan Total Prescribed Dose: 50.4 Gy
Plan Total Prescribed Dose: 50.4 Gy
Reference Point Dosage Given to Date: 37.8 Gy
Reference Point Dosage Given to Date: 37.8 Gy
Reference Point Session Dosage Given: 1.8 Gy
Reference Point Session Dosage Given: 1.8 Gy
Session Number: 21

## 2023-06-30 NOTE — Progress Notes (Signed)
 Patient provided documents for one-time $1000 [employer]. Patient approved for grant and signed paperwork and received a copy of the approval letter and expense sheet in green folder. She provided documents for expense she wishes to submit.  She has my card for any additional financial questions or concerns.

## 2023-06-30 NOTE — Progress Notes (Signed)
 [facility] Clinical Social Work  Initial Assessment   Terry Morgan is a 43 y.o. year old female presenting alone. Clinical Social Work was referred by nurse navigator for assessment of psychosocial needs.   SDOH (Social Determinants of Health) assessments performed: Yes SDOH Interventions    Flowsheet Row Clinical Support from 06/30/2023 in [facility]  SDOH Interventions   Food Insecurity Interventions Other (Comment), Community Resources Provided  [facility] food pantry]  Housing Interventions Other (Comment)  [cancer foundations]  Transportation Interventions Intervention Not Indicated  Utilities Interventions Other (Comment)  [cancer foundations]  Financial Strain Interventions Other (Comment)  [cancer foundations]       SDOH Screenings   Food Insecurity: Food Insecurity Present (06/30/2023)  Housing: High Risk (06/30/2023)  Transportation Needs: No Transportation Needs (06/30/2023)  Utilities: At Risk (06/30/2023)  Financial Resource Strain: High Risk (06/30/2023)  Physical Activity: Insufficiently Active (10/27/2022)   Received from [facility]  Social Connections: Socially Integrated (10/27/2022)   Received from [facility]  Stress: No Stress Concern Present (04/15/2023)   Received from [facility]  Tobacco Use: Medium Risk (05/13/2023)     Distress Screen completed: No     No data to display            Family/Social Information:  Housing Arrangement: patient lives with her 55 yo son (senior in [employer]) Family members/support persons in your life? Family and Friends Transportation concerns: no  Employment: out of work due to treatment and diagnosis. 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 Has SNAP but does not cover the whole month Advanced directives: Not known Services Currently in place:  Healthy blue Medicaid, SNAP, previously LIEAP, has accessed [employer], [employer], signed up for [employer], previously received [employer]. Connected to [employer] Adjustment to diagnosis: Patient understands treatment plan and what happens next? yes, is through much of her treatment. Currently in radiation Concerns about diagnosis and/or treatment:  Costs and loss of income; overwhelmed Patient reported stressors: Finances and Adjusting to my illness Hopes and/or priorities: find help with financial needs Current coping skills/ strengths: Ability for insight , Capable of independent living , Communication skills , Motivation for treatment/growth , Supportive family/friends , and Other: ability to access resources    SUMMARY: Current SDOH Barriers:  Financial constraints related to loss of work due to cancer diagnosis & treatment  Clinical Social Work Clinical Goal(s):  Explore community resource options for unmet needs related to:  Financial Strain   Interventions: Informed patient of the support team roles and support services at [facility] Provided CSW contact information and encouraged patient to call with any questions or concerns Began process of applying to different cancer foundations ([employer] inc, [employer], [employer] [place]). Waiting on supporting documents from pt Provided bag from [facility] food pantry   Follow Up Plan: Patient will send documents for applications and CSW will submit to the organizations.  Patient verbalizes understanding of plan: Yes    Terry Riordan E Justice Milliron, LCSW Clinical Social Worker [facility]  Patient is participating in a Managed Medicaid Plan:  Yes

## 2023-07-01 ENCOUNTER — Ambulatory Visit
Admission: RE | Admit: 2023-07-01 | Discharge: 2023-07-01 | Disposition: A | Discharge status: Home / Self Care | Payer: Medicaid Other | Source: Ambulatory Visit | Attending: Radiation Oncology | Admitting: Radiation Oncology

## 2023-07-01 ENCOUNTER — Ambulatory Visit: Payer: Medicaid Other | Admitting: Radiation Oncology

## 2023-07-01 ENCOUNTER — Other Ambulatory Visit: Payer: Self-pay

## 2023-07-01 DIAGNOSIS — C50411 Malignant neoplasm of upper-outer quadrant of right female breast: Secondary | ICD-10-CM | POA: Diagnosis not present

## 2023-07-01 LAB — RAD ONC ARIA SESSION SUMMARY
Course Elapsed Days: 32
Plan Fractions Treated to Date: 22
Plan Fractions Treated to Date: 22
Plan Prescribed Dose Per Fraction: 1.8 Gy
Plan Prescribed Dose Per Fraction: 1.8 Gy
Plan Total Fractions Prescribed: 28
Plan Total Fractions Prescribed: 28
Plan Total Prescribed Dose: 50.4 Gy
Plan Total Prescribed Dose: 50.4 Gy
Reference Point Dosage Given to Date: 39.6 Gy
Reference Point Dosage Given to Date: 39.6 Gy
Reference Point Session Dosage Given: 1.8 Gy
Reference Point Session Dosage Given: 1.8 Gy
Session Number: 22

## 2023-07-04 ENCOUNTER — Ambulatory Visit

## 2023-07-04 ENCOUNTER — Ambulatory Visit: Payer: Medicaid Other | Admitting: Radiation Oncology

## 2023-07-04 ENCOUNTER — Ambulatory Visit
Admission: RE | Admit: 2023-07-04 | Discharge: 2023-07-04 | Disposition: A | Discharge status: Home / Self Care | Payer: Medicaid Other | Source: Ambulatory Visit | Attending: Radiation Oncology

## 2023-07-04 ENCOUNTER — Other Ambulatory Visit: Payer: Self-pay

## 2023-07-04 ENCOUNTER — Encounter: Payer: Self-pay | Admitting: Licensed Clinical Social Worker

## 2023-07-04 DIAGNOSIS — C50411 Malignant neoplasm of upper-outer quadrant of right female breast: Secondary | ICD-10-CM | POA: Diagnosis not present

## 2023-07-04 LAB — RAD ONC ARIA SESSION SUMMARY
Course Elapsed Days: 35
Plan Fractions Treated to Date: 23
Plan Fractions Treated to Date: 23
Plan Prescribed Dose Per Fraction: 1.8 Gy
Plan Prescribed Dose Per Fraction: 1.8 Gy
Plan Total Fractions Prescribed: 28
Plan Total Fractions Prescribed: 28
Plan Total Prescribed Dose: 50.4 Gy
Plan Total Prescribed Dose: 50.4 Gy
Reference Point Dosage Given to Date: 41.4 Gy
Reference Point Dosage Given to Date: 41.4 Gy
Reference Point Session Dosage Given: 1.8 Gy
Reference Point Session Dosage Given: 1.8 Gy
Session Number: 23

## 2023-07-04 NOTE — Progress Notes (Signed)
 [facility] CSW Progress Note  Visual merchandiser received supporting documents from patient and submitted applications to [employer] [place] and [employer]. Pt notified that foundations will contact her regarding assistance.    Laurel Smeltz E Tilden Broz, LCSW Clinical Social Worker [facility]    Patient is participating in a Managed Medicaid Plan:  Yes

## 2023-07-05 ENCOUNTER — Other Ambulatory Visit: Payer: Self-pay

## 2023-07-05 ENCOUNTER — Ambulatory Visit
Admission: RE | Admit: 2023-07-05 | Discharge: 2023-07-05 | Disposition: A | Discharge status: Home / Self Care | Payer: Medicaid Other | Source: Ambulatory Visit | Attending: Radiation Oncology

## 2023-07-05 ENCOUNTER — Telehealth: Payer: Self-pay

## 2023-07-05 ENCOUNTER — Ambulatory Visit: Payer: Medicaid Other | Admitting: Radiation Oncology

## 2023-07-05 DIAGNOSIS — C50411 Malignant neoplasm of upper-outer quadrant of right female breast: Secondary | ICD-10-CM | POA: Diagnosis not present

## 2023-07-05 LAB — RAD ONC ARIA SESSION SUMMARY
Course Elapsed Days: 36
Plan Fractions Treated to Date: 24
Plan Fractions Treated to Date: 24
Plan Prescribed Dose Per Fraction: 1.8 Gy
Plan Prescribed Dose Per Fraction: 1.8 Gy
Plan Total Fractions Prescribed: 28
Plan Total Fractions Prescribed: 28
Plan Total Prescribed Dose: 50.4 Gy
Plan Total Prescribed Dose: 50.4 Gy
Reference Point Dosage Given to Date: 43.2 Gy
Reference Point Dosage Given to Date: 43.2 Gy
Reference Point Session Dosage Given: 1.8 Gy
Reference Point Session Dosage Given: 1.8 Gy
Session Number: 24

## 2023-07-05 NOTE — Telephone Encounter (Signed)
 Left message on voicemail about appointment for 07/06/23

## 2023-07-06 ENCOUNTER — Ambulatory Visit: Payer: Medicaid Other | Admitting: Radiation Oncology

## 2023-07-06 ENCOUNTER — Other Ambulatory Visit: Payer: Self-pay

## 2023-07-06 ENCOUNTER — Ambulatory Visit
Admission: RE | Admit: 2023-07-06 | Discharge: 2023-07-06 | Disposition: A | Discharge status: Home / Self Care | Payer: Medicaid Other | Source: Ambulatory Visit | Attending: Radiation Oncology

## 2023-07-06 ENCOUNTER — Encounter: Payer: Self-pay | Admitting: Licensed Clinical Social Worker

## 2023-07-06 ENCOUNTER — Inpatient Hospital Stay (HOSPITAL_BASED_OUTPATIENT_CLINIC_OR_DEPARTMENT_OTHER): Payer: Medicaid Other | Admitting: Hematology and Oncology

## 2023-07-06 VITALS — BP 137/83 | HR 80 | Temp 98.4°F | Resp 17 | Wt 242.7 lb

## 2023-07-06 DIAGNOSIS — C50411 Malignant neoplasm of upper-outer quadrant of right female breast: Secondary | ICD-10-CM

## 2023-07-06 DIAGNOSIS — Z170 Estrogen receptor positive status [ER+]: Secondary | ICD-10-CM | POA: Diagnosis not present

## 2023-07-06 LAB — RAD ONC ARIA SESSION SUMMARY
Course Elapsed Days: 37
Plan Fractions Treated to Date: 25
Plan Fractions Treated to Date: 25
Plan Prescribed Dose Per Fraction: 1.8 Gy
Plan Prescribed Dose Per Fraction: 1.8 Gy
Plan Total Fractions Prescribed: 28
Plan Total Fractions Prescribed: 28
Plan Total Prescribed Dose: 50.4 Gy
Plan Total Prescribed Dose: 50.4 Gy
Reference Point Dosage Given to Date: 45 Gy
Reference Point Dosage Given to Date: 45 Gy
Reference Point Session Dosage Given: 1.8 Gy
Reference Point Session Dosage Given: 1.8 Gy
Session Number: 25

## 2023-07-06 NOTE — Progress Notes (Signed)
 [facility] CSW Progress Note  Clinical Child psychotherapist received e-mail notice from [employer] that pt was approved for assistance. CSW sent notification to patient.    Park Beck E Warren Lindahl, LCSW Clinical Social Worker [facility]    Patient is participating in a Managed Medicaid Plan:  Yes

## 2023-07-06 NOTE — Assessment & Plan Note (Signed)
 This is a very pleasant 43 year old premenopausal female patient initially diagnosed with invasive ductal carcinoma, grade 2 of the right breast back in May 2024 treated at [facility] transferring her care to [facility] health.  Right Breast Cancer Diagnosed in May 2024 with a 4.4 cm tumor and 5/9 lymph nodes positive. No extracapsular extension. Completed neoadjuvant chemotherapy with Adriamycin, cyclophosphamide, and paclitaxel with residual disease RCB III at surgery. Strongly estrogen and progesterone receptor positive. No further chemotherapy recommended at this time. -She is now undergoing adjuvant radiation. High risk of recurrence due to lymph node involvement. Hesitant about antiestrogen therapy due to past adverse experiences.  - Complete radiation therapy as scheduled. - Re-evaluate condition at the end of April 2025. - Discuss starting anastrozole and ovarian suppression shot post-radiation. - Consider Verzenio after healing from radiation. - Provide information on MRD testing for minimal residual disease detection. - Discuss potential benefits and risks of antiestrogen therapy, including risk of blood clots and endometrial cancer with tamoxifen.  Goals of Care Seeks balance between effective treatment and quality of life. Hesitant about menopause-inducing treatments. Interested in holistic methods. - Support interest in holistic methods alongside conventional treatments. - Provide information on integrative medicine options, such as those available at [facility] integrative medicine clinic.   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 No immediate plans for bilateral mastectomy.  -We once again discussed about intensive screening if she is not to undergo bilateral mastectomy.  I would like to see her for intensive surveillance.  She will also need a referral to gynecological oncology with consideration of bilateral oophorectomy and she is open to discussing more about  this.  Follow-up Scheduled to complete radiation therapy by July 15, 2023. - Schedule follow-up appointment at the end of April 2025 to reassess condition and discuss further treatment options.

## 2023-07-06 NOTE — Progress Notes (Signed)
 [facility] CONSULT NOTE  Patient Care Team: Associates, [facility] as PCP - General (Family Medicine) Pershing Proud, RN as Oncology Nurse Navigator Donnelly Angelica, RN as Oncology Nurse Navigator Rachel Moulds, MD as Consulting Physician (Hematology and Oncology) Lonie Peak, MD as Attending Physician (Radiation Oncology)  CHIEF COMPLAINTS/PURPOSE OF CONSULTATION:  IDC right breast, ER positive  ASSESSMENT & PLAN:  Breast cancer of upper-outer quadrant of right female breast [facility]) This is a very pleasant 43 year old premenopausal female patient initially diagnosed with invasive ductal carcinoma, grade 2 of the right breast back in May 2024 treated at [facility] transferring her care to [facility] health.  Right Breast Cancer Diagnosed in May 2024 with a 4.4 cm tumor and 5/9 lymph nodes positive. No extracapsular extension. Completed neoadjuvant chemotherapy with Adriamycin, cyclophosphamide, and paclitaxel with residual disease RCB III at surgery. Strongly estrogen and progesterone receptor positive. No further chemotherapy recommended at this time. -She is now undergoing adjuvant radiation. High risk of recurrence due to lymph node involvement. Hesitant about antiestrogen therapy due to past adverse experiences.  - Complete radiation therapy as scheduled. - Re-evaluate condition at the end of April 2025. - Discuss starting anastrozole and ovarian suppression shot post-radiation. - Consider Verzenio after healing from radiation. - Provide information on MRD testing for minimal residual disease detection. - Discuss potential benefits and risks of antiestrogen therapy, including risk of blood clots and endometrial cancer with tamoxifen.  Goals of Care Seeks balance between effective treatment and quality of life. Hesitant about menopause-inducing treatments. Interested in holistic methods. - Support interest in holistic methods alongside conventional  treatments. - Provide information on integrative medicine options, such as those available at [facility] integrative medicine clinic.   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 No immediate plans for bilateral mastectomy.  -We once again discussed about intensive screening if she is not to undergo bilateral mastectomy.  I would like to see her for intensive surveillance.  She will also need a referral to gynecological oncology with consideration of bilateral oophorectomy and she is open to discussing more about this.  Follow-up Scheduled to complete radiation therapy by July 15, 2023. - Schedule follow-up appointment at the end of April 2025 to reassess condition and discuss further treatment options.   No orders of the defined types were placed in this encounter.    HISTORY OF PRESENTING ILLNESS:  Terry Morgan 43 y.o. female is here because of new diagnosis of breast cancer.  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, grade 2, ER 100%, PR 100%, HER2 1+ 09/20/2022 genetic testing: 34 [employer] gene panel positive: pathogenic BRCA2 mutation:   ONCOLOGY TREATMENT HISTORY: 08/24/2022 screening mammogram = right superior breast mass and abnormal axillary lymph node  09/03/2022 additional view mammogram + right breast ultrasound = large irregular mass in the superior right breast with abnormal axillary lymph nodes 09/10/2022 biopsies: Right breast = invasive ductal carcinoma, grade 2, ER 100%, PR 100%, HER2 1+ Right axillary lymph node = metastatic mammary carcinoma 09/23/2022 CT CAP = infiltrating right breast mass measuring 4.0 x 3.9 cm, right axillary adenopathy, hepatomegaly, hepatic steatosis, no evidence of metastatic disease 09/23/2022 bone scan = mild increase in radiotracer uptake in the posterior aspect of S1 possibly degenerative in nature but attention to follow-up is recommended 10/19/2022 started neoadjuvant chemotherapy dose dense AC  followed by weekly Taxol  04/15/2023, she had right breast lumpectomy and this showed invasive residual adenocarcinoma 44 mm  grade 1, extensive DCIS, margins negative, anterior and medial margin less than 1 mm, final pathologic staging pT2 N2a RCB 3, 5 out of 9 lymph nodes with metastatic carcinoma, no evidence of extracapsular extension.  Given the poor response with neoadjuvant chemotherapy, she wanted to seek a second opinion about adjuvant care and hence referred to [facility] health.  Discussed the use of AI scribe software for clinical note transcription with the patient, who gave verbal consent to proceed.  History of Present Illness    Terry Morgan is a 43 year old female with breast cancer undergoing radiation therapy who presents with concerns about treatment side effects and future management options.  She is currently undergoing radiation therapy for breast cancer, with a few sessions remaining. She is experiencing significant skin irritation and rawness, which is causing her to consider taking a break from treatment. The last session is tentatively scheduled for March 28.  She has been discussing the use of Verzenio and anastrozole with [facility], but has decided not to start these treatments immediately due to concerns about side effects. She has a BRCA2 mutation, raising concerns about an increased risk of ovarian cancer. She is hesitant about the ovarian suppression shot due to unpleasant menopause-like symptoms experienced during chemotherapy.  She feels 'beaten up' by the treatments, spending most of her days sleeping, which indicates a significant impact on her quality of life. She is considering holistic methods in conjunction with conventional treatments but has not yet found a holistic doctor.  She has undergone chemotherapy and surgery and expresses frustration about the lack of information on alternative treatment options during her initial treatment phase at OSH. She feels  overwhelmed by the aggressive treatment approach and is cautious about future decisions.   MEDICAL HISTORY:  Past Medical History:  Diagnosis Date   Blood transfusion without reported diagnosis    CAD (coronary artery disease)    GERD (gastroesophageal reflux disease)    Menorrhagia    Nausea    NSTEMI (non-ST elevated myocardial infarction) (HCC)    a. NSTEMI 08/2014 due to acute occlusion of the RCA s/p aspiration thrombectomy and overlapping DES - EF 50-55%.   Obesity    Thrombocytosis 09/11/2014   Tobacco abuse     SURGICAL HISTORY: Past Surgical History:  Procedure Laterality Date   CARDIAC CATHETERIZATION N/A 09/12/2014   Procedure: Left Heart Cath and Coronary Angiography;  Surgeon: Tonny Bollman, MD;  Location: [facility] INVASIVE CV LAB;  Service: Cardiovascular;  Laterality: N/A;   CARDIAC CATHETERIZATION N/A 09/12/2014   Procedure: Coronary Stent Intervention;  Surgeon: Tonny Bollman, MD;  Location: [facility] INVASIVE CV LAB;  Service: Cardiovascular;  Laterality: N/A;  aspiration thrombectomy   CESAREAN SECTION      SOCIAL HISTORY: Social History   Socioeconomic History   Marital status: Single    Spouse name: Not on file   Number of children: Not on file   Years of education: Not on file   Highest education level: Not on file  Occupational History   Not on file  Tobacco Use   Smoking status: Former    Current packs/day: 0.00    Types: Cigarettes   Smokeless tobacco: Never  Substance and Sexual Activity   Alcohol use: No    Alcohol/week: 0.0 standard drinks of alcohol   Drug use: No   Sexual activity: Not Currently  Other Topics Concern   Not on file  Social History Narrative   Not on file   Social Drivers of Health  Financial Resource Strain: High Risk (06/30/2023)   Overall Financial Resource Strain (CARDIA)    Difficulty of Paying Living Expenses: Very hard  Food Insecurity: Food Insecurity Present (06/30/2023)   Hunger Vital Sign    Worried About Running Out  of Food in the Last Year: Sometimes true    Ran Out of Food in the Last Year: Never true  Transportation Needs: No Transportation Needs (06/30/2023)   PRAPARE - Administrator, Civil Service (Medical): No    Lack of Transportation (Non-Medical): No  Physical Activity: Insufficiently Active (10/27/2022)   Received from [facility]   Exercise Vital Sign    Days of Exercise per Week: 2 days    Minutes of Exercise per Session: 20 min  Stress: No Stress Concern Present (04/15/2023)   Received from [facility] Occupational [facility] - Occupational Stress Questionnaire    Feeling of Stress : Not at all  Social Connections: Socially Integrated (10/27/2022)   Received from [facility]   Social Network    How would you rate your social network (family, work, friends)?: Good participation with social networks  Intimate Partner Violence: Not At Risk (04/15/2023)   Received from [facility]   HITS    Over the last 12 months how often did your partner physically hurt you?: Never    Over the last 12 months how often did your partner insult you or talk down to you?: Never    Over the last 12 months how often did your partner threaten you with physical harm?: Never    Over the last 12 months how often did your partner scream or curse at you?: Never    FAMILY HISTORY: Family History  Problem Relation Age of Onset   Hypertension Mother    Hyperlipidemia Father    Prostate cancer Father    Stroke Sister 48   Healthy Sister    Healthy Sister    Healthy Brother    Healthy Brother    Healthy Brother    Healthy Brother    Healthy Brother    Asthma Brother    Asthma Brother    Heart disease Paternal Grandmother    Heart murmur Cousin     ALLERGIES:  is allergic to amoxicillin and hydrocortisone.  MEDICATIONS:  Current Outpatient Medications  Medication Sig Dispense Refill   atorvastatin (LIPITOR) 10 MG tablet Take 10 mg by mouth daily.     azithromycin  (ZITHROMAX) 250 MG tablet Take 1 tablet (250 mg total) by mouth daily. Take first 2 tablets together, then 1 every day until finished. 6 tablet 0   canagliflozin (INVOKANA) 300 MG TABS tablet Take 300 mg by mouth daily before breakfast.     ferrous gluconate (FERGON) 324 MG tablet Take 324 mg by mouth daily with breakfast.     fluticasone (FLONASE) 50 MCG/ACT nasal spray Place 1 spray into both nostrils daily. 16 g 0   RIVAROXABAN (XARELTO) VTE STARTER PACK (15 & 20 MG) Follow package directions: Take one 15mg  tablet by mouth twice a day. On day 22, switch to one 20mg  tablet once a day. Take with food.. 51 each 0   No current facility-administered medications for this visit.     PHYSICAL EXAMINATION: ECOG PERFORMANCE STATUS: 0 - Asymptomatic  Vitals:   07/06/23 1543  BP: 137/83  Pulse: 80  Resp: 17  Temp: 98.4 F (36.9 C)  SpO2: 97%    Filed Weights   07/06/23 1543  Weight:  242 lb 11.2 oz (110.1 kg)    PE deferred in lieu of counseling  LABORATORY DATA:  I have reviewed the data as listed Lab Results  Component Value Date   WBC 4.4 04/01/2023   HGB 13.1 04/01/2023   HCT 41.0 04/01/2023   MCV 83.5 04/01/2023   PLT 199 04/01/2023     Chemistry      Component Value Date/Time   NA 133 (L) 04/01/2023 1035   K 3.6 04/01/2023 1035   CL 102 04/01/2023 1035   CO2 25 04/01/2023 1035   BUN 9 04/01/2023 1035   CREATININE 0.61 04/01/2023 1035   CREATININE 0.79 02/19/2015 0825      Component Value Date/Time   CALCIUM 8.7 (L) 04/01/2023 1035   ALKPHOS 56 02/19/2015 0825   AST 23 02/19/2015 0825   ALT 19 02/19/2015 0825   BILITOT 0.4 02/19/2015 0825       RADIOGRAPHIC STUDIES: I have personally reviewed the radiological images as listed and agreed with the findings in the report. No results found.   All questions were answered. The patient knows to call the clinic with any problems, questions or concerns.  Time spent: 30 min     Rachel Moulds, MD 07/06/2023  4:40 PM

## 2023-07-07 ENCOUNTER — Telehealth: Payer: Self-pay | Admitting: Hematology and Oncology

## 2023-07-07 ENCOUNTER — Other Ambulatory Visit: Payer: Self-pay

## 2023-07-07 ENCOUNTER — Encounter: Payer: Self-pay | Admitting: Licensed Clinical Social Worker

## 2023-07-07 ENCOUNTER — Ambulatory Visit
Admission: RE | Admit: 2023-07-07 | Discharge: 2023-07-07 | Disposition: A | Discharge status: Home / Self Care | Payer: Medicaid Other | Source: Ambulatory Visit | Attending: Radiation Oncology | Admitting: Radiation Oncology

## 2023-07-07 ENCOUNTER — Ambulatory Visit: Payer: Medicaid Other

## 2023-07-07 DIAGNOSIS — C50411 Malignant neoplasm of upper-outer quadrant of right female breast: Secondary | ICD-10-CM | POA: Diagnosis not present

## 2023-07-07 LAB — RAD ONC ARIA SESSION SUMMARY
Course Elapsed Days: 38
Plan Fractions Treated to Date: 26
Plan Fractions Treated to Date: 26
Plan Prescribed Dose Per Fraction: 1.8 Gy
Plan Prescribed Dose Per Fraction: 1.8 Gy
Plan Total Fractions Prescribed: 28
Plan Total Fractions Prescribed: 28
Plan Total Prescribed Dose: 50.4 Gy
Plan Total Prescribed Dose: 50.4 Gy
Reference Point Dosage Given to Date: 46.8 Gy
Reference Point Dosage Given to Date: 46.8 Gy
Reference Point Session Dosage Given: 1.8 Gy
Reference Point Session Dosage Given: 1.8 Gy
Session Number: 26

## 2023-07-07 NOTE — Telephone Encounter (Signed)
 Spoke with patient confirming upcoming appointment

## 2023-07-08 ENCOUNTER — Ambulatory Visit
Admission: RE | Admit: 2023-07-08 | Discharge: 2023-07-08 | Disposition: A | Discharge status: Home / Self Care | Source: Ambulatory Visit | Attending: Radiation Oncology | Admitting: Radiation Oncology

## 2023-07-08 ENCOUNTER — Other Ambulatory Visit: Payer: Self-pay

## 2023-07-08 ENCOUNTER — Ambulatory Visit: Payer: Medicaid Other

## 2023-07-08 ENCOUNTER — Ambulatory Visit: Payer: Medicaid Other | Admitting: Radiation Oncology

## 2023-07-08 DIAGNOSIS — C50411 Malignant neoplasm of upper-outer quadrant of right female breast: Secondary | ICD-10-CM | POA: Diagnosis not present

## 2023-07-08 LAB — RAD ONC ARIA SESSION SUMMARY
Course Elapsed Days: 39
Plan Fractions Treated to Date: 27
Plan Fractions Treated to Date: 27
Plan Prescribed Dose Per Fraction: 1.8 Gy
Plan Prescribed Dose Per Fraction: 1.8 Gy
Plan Total Fractions Prescribed: 28
Plan Total Fractions Prescribed: 28
Plan Total Prescribed Dose: 50.4 Gy
Plan Total Prescribed Dose: 50.4 Gy
Reference Point Dosage Given to Date: 48.6 Gy
Reference Point Dosage Given to Date: 48.6 Gy
Reference Point Session Dosage Given: 1.8 Gy
Reference Point Session Dosage Given: 1.8 Gy
Session Number: 27

## 2023-07-11 ENCOUNTER — Ambulatory Visit

## 2023-07-11 ENCOUNTER — Ambulatory Visit: Payer: Medicaid Other | Admitting: Radiation Oncology

## 2023-07-11 ENCOUNTER — Ambulatory Visit: Payer: Medicaid Other

## 2023-07-12 ENCOUNTER — Ambulatory Visit

## 2023-07-12 ENCOUNTER — Ambulatory Visit: Payer: Medicaid Other

## 2023-07-12 ENCOUNTER — Encounter: Payer: Self-pay | Admitting: Licensed Clinical Social Worker

## 2023-07-12 ENCOUNTER — Ambulatory Visit: Payer: Medicaid Other | Admitting: Radiation Oncology

## 2023-07-12 NOTE — Progress Notes (Signed)
 [facility] CSW Progress Note  Visual merchandiser received and printed application to [employer] on [employer].  Spoke with pt and informed her that application will be at front desk and what is needed to complete it.  Pt voiced understanding.    Tia Gelb E Oliva Montecalvo, LCSW Clinical Social Worker [facility]    Patient is participating in a Managed Medicaid Plan:  Yes

## 2023-07-13 ENCOUNTER — Ambulatory Visit: Payer: Medicaid Other

## 2023-07-13 ENCOUNTER — Ambulatory Visit

## 2023-07-13 ENCOUNTER — Other Ambulatory Visit: Payer: Self-pay

## 2023-07-13 ENCOUNTER — Ambulatory Visit
Admission: RE | Admit: 2023-07-13 | Discharge: 2023-07-13 | Disposition: A | Discharge status: Home / Self Care | Source: Ambulatory Visit | Attending: Radiation Oncology | Admitting: Radiation Oncology

## 2023-07-13 DIAGNOSIS — C50411 Malignant neoplasm of upper-outer quadrant of right female breast: Secondary | ICD-10-CM | POA: Diagnosis not present

## 2023-07-13 LAB — RAD ONC ARIA SESSION SUMMARY
Course Elapsed Days: 44
Plan Fractions Treated to Date: 28
Plan Prescribed Dose Per Fraction: 1.8 Gy
Plan Total Fractions Prescribed: 28
Plan Total Prescribed Dose: 50.4 Gy
Reference Point Dosage Given to Date: 50.4 Gy
Reference Point Session Dosage Given: 1.8 Gy
Session Number: 28

## 2023-07-14 ENCOUNTER — Other Ambulatory Visit: Payer: Self-pay

## 2023-07-14 ENCOUNTER — Ambulatory Visit: Payer: Medicaid Other

## 2023-07-14 ENCOUNTER — Ambulatory Visit

## 2023-07-14 ENCOUNTER — Ambulatory Visit
Admission: RE | Admit: 2023-07-14 | Discharge: 2023-07-14 | Disposition: A | Discharge status: Home / Self Care | Source: Ambulatory Visit | Attending: Radiation Oncology

## 2023-07-14 DIAGNOSIS — C50411 Malignant neoplasm of upper-outer quadrant of right female breast: Secondary | ICD-10-CM | POA: Diagnosis not present

## 2023-07-14 LAB — RAD ONC ARIA SESSION SUMMARY
Course Elapsed Days: 45
Plan Fractions Treated to Date: 1
Plan Prescribed Dose Per Fraction: 2 Gy
Plan Total Fractions Prescribed: 5
Plan Total Prescribed Dose: 10 Gy
Reference Point Dosage Given to Date: 2 Gy
Reference Point Session Dosage Given: 2 Gy
Session Number: 29

## 2023-07-15 ENCOUNTER — Other Ambulatory Visit: Payer: Self-pay

## 2023-07-15 ENCOUNTER — Inpatient Hospital Stay: Admitting: Licensed Clinical Social Worker

## 2023-07-15 ENCOUNTER — Ambulatory Visit
Admission: RE | Admit: 2023-07-15 | Discharge: 2023-07-15 | Disposition: A | Discharge status: Home / Self Care | Source: Ambulatory Visit | Attending: Radiation Oncology | Admitting: Radiation Oncology

## 2023-07-15 ENCOUNTER — Ambulatory Visit

## 2023-07-15 DIAGNOSIS — C50411 Malignant neoplasm of upper-outer quadrant of right female breast: Secondary | ICD-10-CM | POA: Diagnosis not present

## 2023-07-15 LAB — RAD ONC ARIA SESSION SUMMARY
Course Elapsed Days: 46
Plan Fractions Treated to Date: 2
Plan Prescribed Dose Per Fraction: 2 Gy
Plan Total Fractions Prescribed: 5
Plan Total Prescribed Dose: 10 Gy
Reference Point Dosage Given to Date: 4 Gy
Reference Point Session Dosage Given: 2 Gy
Session Number: 30

## 2023-07-15 NOTE — Progress Notes (Signed)
 [facility] CSW Progress Note  Patient came to support services to drop off [employer] application. CSW faxed application and supporting documents.  Pt stated she previously submitted an application to [employer] but has not heard back and requested CSW reach out. CSW e-mailed [employer] inquiring status of application and if further paperwork is needed.  CSW provided bag from food pantry today.  Scheduled pt for counseling session at pt's request for 4/3 at 2:30pm    [name] E Traveon Louro, LCSW Clinical Social Worker [facility]    Patient is participating in a Managed Medicaid Plan:  Yes

## 2023-07-18 ENCOUNTER — Other Ambulatory Visit: Payer: Self-pay

## 2023-07-18 ENCOUNTER — Ambulatory Visit

## 2023-07-18 ENCOUNTER — Ambulatory Visit
Admission: RE | Admit: 2023-07-18 | Discharge: 2023-07-18 | Disposition: A | Discharge status: Home / Self Care | Source: Ambulatory Visit | Attending: Radiation Oncology

## 2023-07-18 DIAGNOSIS — C50411 Malignant neoplasm of upper-outer quadrant of right female breast: Secondary | ICD-10-CM | POA: Diagnosis not present

## 2023-07-18 LAB — RAD ONC ARIA SESSION SUMMARY
Course Elapsed Days: 49
Plan Fractions Treated to Date: 3
Plan Prescribed Dose Per Fraction: 2 Gy
Plan Total Fractions Prescribed: 5
Plan Total Prescribed Dose: 10 Gy
Reference Point Dosage Given to Date: 6 Gy
Reference Point Session Dosage Given: 2 Gy
Session Number: 31

## 2023-07-19 ENCOUNTER — Ambulatory Visit

## 2023-07-19 ENCOUNTER — Encounter: Payer: Self-pay | Admitting: Licensed Clinical Social Worker

## 2023-07-19 NOTE — Progress Notes (Signed)
 Received response from [facility] that they reached out to pt and previous navigator in January 2025 with no response. Since pt is completing treatment tomorrow, she is no longer eligible for assistance through their program. Patient sent message with decision from [facility].    Ferdinando Lodge E Chizaram Latino, LCSW

## 2023-07-19 NOTE — Progress Notes (Signed)
 [facility] CSW Progress Note  Visual merchandiser received notification that pt was approved for assistance through [employer] [place]. Pt notified directly by the foundation.    Terry Mccauley E Layten Aiken, LCSW Clinical Social Worker [facility]    Patient is participating in a Managed Medicaid Plan:  Yes

## 2023-07-20 ENCOUNTER — Ambulatory Visit

## 2023-07-21 ENCOUNTER — Ambulatory Visit

## 2023-07-21 ENCOUNTER — Inpatient Hospital Stay: Attending: Licensed Clinical Social Worker | Admitting: Licensed Clinical Social Worker

## 2023-07-21 DIAGNOSIS — C50411 Malignant neoplasm of upper-outer quadrant of right female breast: Secondary | ICD-10-CM | POA: Insufficient documentation

## 2023-07-21 DIAGNOSIS — C773 Secondary and unspecified malignant neoplasm of axilla and upper limb lymph nodes: Secondary | ICD-10-CM | POA: Insufficient documentation

## 2023-07-21 DIAGNOSIS — Z1501 Genetic susceptibility to malignant neoplasm of breast: Secondary | ICD-10-CM | POA: Insufficient documentation

## 2023-07-21 DIAGNOSIS — Z1721 Progesterone receptor positive status: Secondary | ICD-10-CM | POA: Insufficient documentation

## 2023-07-21 DIAGNOSIS — Z1502 Genetic susceptibility to malignant neoplasm of ovary: Secondary | ICD-10-CM | POA: Insufficient documentation

## 2023-07-21 DIAGNOSIS — Z9221 Personal history of antineoplastic chemotherapy: Secondary | ICD-10-CM | POA: Insufficient documentation

## 2023-07-21 DIAGNOSIS — Z87891 Personal history of nicotine dependence: Secondary | ICD-10-CM | POA: Insufficient documentation

## 2023-07-21 DIAGNOSIS — Z1509 Genetic susceptibility to other malignant neoplasm: Secondary | ICD-10-CM | POA: Insufficient documentation

## 2023-07-21 DIAGNOSIS — Z170 Estrogen receptor positive status [ER+]: Secondary | ICD-10-CM | POA: Insufficient documentation

## 2023-07-21 DIAGNOSIS — Z923 Personal history of irradiation: Secondary | ICD-10-CM | POA: Insufficient documentation

## 2023-07-22 ENCOUNTER — Ambulatory Visit
Admission: RE | Admit: 2023-07-22 | Discharge: 2023-07-22 | Disposition: A | Discharge status: Home / Self Care | Source: Ambulatory Visit | Attending: Radiation Oncology | Admitting: Radiation Oncology

## 2023-07-22 ENCOUNTER — Ambulatory Visit: Admission: RE | Admit: 2023-07-22 | Source: Ambulatory Visit

## 2023-07-22 DIAGNOSIS — Z170 Estrogen receptor positive status [ER+]: Secondary | ICD-10-CM | POA: Insufficient documentation

## 2023-07-22 DIAGNOSIS — C50411 Malignant neoplasm of upper-outer quadrant of right female breast: Secondary | ICD-10-CM | POA: Insufficient documentation

## 2023-07-25 ENCOUNTER — Ambulatory Visit
Admission: RE | Admit: 2023-07-25 | Discharge: 2023-07-25 | Disposition: A | Discharge status: Home / Self Care | Source: Ambulatory Visit | Attending: Radiation Oncology | Admitting: Radiation Oncology

## 2023-07-25 ENCOUNTER — Other Ambulatory Visit: Payer: Self-pay

## 2023-07-25 ENCOUNTER — Ambulatory Visit

## 2023-07-25 DIAGNOSIS — Z170 Estrogen receptor positive status [ER+]: Secondary | ICD-10-CM | POA: Diagnosis present

## 2023-07-25 DIAGNOSIS — C50411 Malignant neoplasm of upper-outer quadrant of right female breast: Secondary | ICD-10-CM | POA: Diagnosis present

## 2023-07-25 LAB — RAD ONC ARIA SESSION SUMMARY
Course Elapsed Days: 56
Plan Fractions Treated to Date: 4
Plan Prescribed Dose Per Fraction: 2 Gy
Plan Total Fractions Prescribed: 5
Plan Total Prescribed Dose: 10 Gy
Reference Point Dosage Given to Date: 8 Gy
Reference Point Session Dosage Given: 2 Gy
Session Number: 32

## 2023-07-26 ENCOUNTER — Ambulatory Visit
Admission: RE | Admit: 2023-07-26 | Discharge: 2023-07-26 | Disposition: A | Discharge status: Home / Self Care | Source: Ambulatory Visit | Attending: Radiation Oncology | Admitting: Radiation Oncology

## 2023-07-26 ENCOUNTER — Other Ambulatory Visit: Payer: Self-pay

## 2023-07-26 DIAGNOSIS — C50411 Malignant neoplasm of upper-outer quadrant of right female breast: Secondary | ICD-10-CM | POA: Diagnosis not present

## 2023-07-26 LAB — RAD ONC ARIA SESSION SUMMARY
Course Elapsed Days: 57
Plan Fractions Treated to Date: 5
Plan Prescribed Dose Per Fraction: 2 Gy
Plan Total Fractions Prescribed: 5
Plan Total Prescribed Dose: 10 Gy
Reference Point Dosage Given to Date: 10 Gy
Reference Point Session Dosage Given: 2 Gy
Session Number: 33

## 2023-07-27 NOTE — Radiation Completion Notes (Signed)
 Patient Name: Terry Morgan, Terry Morgan MRN: [id number] Date of Birth: 27-May-1980 Referring Physician: Tonita Cong, M.D. Date of Service: 2023-07-27 Radiation Oncologist: Lonie Peak, M.D. [facility]                             RADIATION ONCOLOGY END OF TREATMENT NOTE     Diagnosis: C50.411 Malignant neoplasm of upper-outer quadrant of right female breast Staging on 2023-05-13: Breast cancer of upper-outer quadrant of right female breast (HCC) T=pT2, N=pN2a, M=cM0 Staging on 2023-05-13: Breast cancer of upper-outer quadrant of right female breast (HCC) T=cT3, N=cN1, M=cM0 Intent: Curative     ==========DELIVERED PLANS==========  First Treatment Date: 2023-05-30 Last Treatment Date: 2023-07-26   Plan Name: Breast_R Site: Breast, Right Technique: 3D Mode: Photon Dose Per Fraction: 1.8 Gy Prescribed Dose (Delivered / Prescribed): 50.4 Gy / 50.4 Gy Prescribed Fxs (Delivered / Prescribed): 28 / 28   Plan Name: Breast_R_SCV Site: Breast, Right Technique: 3D Mode: Photon Dose Per Fraction: 1.8 Gy Prescribed Dose (Delivered / Prescribed): 48.6 Gy / 50.4 Gy Prescribed Fxs (Delivered / Prescribed): 27 / 28   Plan Name: Breast_R_Bst Site: Breast, Right Technique: 3D Mode: Photon Dose Per Fraction: 2 Gy Prescribed Dose (Delivered / Prescribed): 10 Gy / 10 Gy Prescribed Fxs (Delivered / Prescribed): 5 / 5     ==========ON TREATMENT VISIT DATES========== 2023-05-30, 2023-06-06, 2023-06-13, 2023-06-17, 2023-06-21, 2023-07-04, 2023-07-13, 2023-07-18, 2023-07-22     ==========UPCOMING VISITS========== 08/18/2023 [facility]-RADIATION ONC FOLLOW UP 20 Lonie Peak, MD  08/03/2023 [facility]-MED ONCOLOGY PORT FLUSH [facility] MED[facility]C FLUSH  08/03/2023 [facility]-MED ONCOLOGY EST PT 15 Rachel Moulds, MD  07/28/2023 [facility]-MED ONCOLOGY SOCIAL WORKER Merlyn Albert, [place]        ==========APPENDIX - ON TREATMENT VISIT NOTES==========   See weekly On Treatment  Notes in Epic for details in the Media tab (listed as Progress notes on the On Treatment Visit Dates listed above).

## 2023-07-28 ENCOUNTER — Inpatient Hospital Stay: Admitting: Licensed Clinical Social Worker

## 2023-07-28 NOTE — Progress Notes (Signed)
 [facility] CSW Counseling Note  Patient was referred by self. Treatment type: Individual  Presenting Concerns: Patient and/or family reports the following symptoms/concerns:  adjustment to trauma Severity of problem: mild   Orientation:oriented to person, place, time/date, and situation.   Affect: Appropriate and Congruent Risk of harm to self or others: No plan to harm self or others  Patient and/or Family's Strengths/Protective Factors: Social connections, Social and Emotional competence, and Sense of purposeAbility for insight  Capable of independent living  Communication skills  Motivation for treatment/growth      Goals Addressed: Patient will:  Increase healthy adjustment to current life circumstances   Progress towards Goals: Initial   Interventions: Interventions utilized:  CBT and Strength-based      Assessment: Patient currently experiencing adjustment post-treatment and processing of trauma from medical experience and link to family trauma.  Pt has done significant self-healing work and desired a safe place to determine if she has other support needs.  Pt shared life review and childhood and family trauma as well as about her medical experience. She is concerned about going into old patterns of coping, however, in her recall of those experiences, she showed significantly different and healthier ways of asserting and advocating for herself.  CSW reflected back the skills pt has learned and displayed.      Plan: Follow up with CSW: 2 weeks Behavioral recommendations: continue focusing on your growth and bringing light and positive energy into your life.  Follow up to determine if any other needs arise as you move into survivorship Referral(s): reiki       Sholom Dulude E Kolden Dupee, LCSW   Patient is participating in a Managed Medicaid Plan:  Yes

## 2023-07-29 ENCOUNTER — Encounter

## 2023-08-02 ENCOUNTER — Telehealth: Payer: Self-pay

## 2023-08-02 NOTE — Telephone Encounter (Signed)
 Spoke with patient and reminded them of the appointment on 08/03/23

## 2023-08-03 ENCOUNTER — Inpatient Hospital Stay

## 2023-08-03 ENCOUNTER — Inpatient Hospital Stay: Admitting: Hematology and Oncology

## 2023-08-03 VITALS — BP 129/86 | HR 69 | Temp 98.2°F | Resp 16 | Wt 240.7 lb

## 2023-08-03 DIAGNOSIS — C50411 Malignant neoplasm of upper-outer quadrant of right female breast: Secondary | ICD-10-CM

## 2023-08-03 DIAGNOSIS — Z1501 Genetic susceptibility to malignant neoplasm of breast: Secondary | ICD-10-CM

## 2023-08-03 DIAGNOSIS — Z170 Estrogen receptor positive status [ER+]: Secondary | ICD-10-CM

## 2023-08-03 DIAGNOSIS — Z1721 Progesterone receptor positive status: Secondary | ICD-10-CM | POA: Diagnosis not present

## 2023-08-03 DIAGNOSIS — Z1509 Genetic susceptibility to other malignant neoplasm: Secondary | ICD-10-CM

## 2023-08-03 DIAGNOSIS — Z1502 Genetic susceptibility to malignant neoplasm of ovary: Secondary | ICD-10-CM | POA: Diagnosis not present

## 2023-08-03 DIAGNOSIS — Z87891 Personal history of nicotine dependence: Secondary | ICD-10-CM | POA: Diagnosis not present

## 2023-08-03 DIAGNOSIS — Z9221 Personal history of antineoplastic chemotherapy: Secondary | ICD-10-CM | POA: Diagnosis not present

## 2023-08-03 DIAGNOSIS — C773 Secondary and unspecified malignant neoplasm of axilla and upper limb lymph nodes: Secondary | ICD-10-CM | POA: Diagnosis not present

## 2023-08-03 DIAGNOSIS — Z923 Personal history of irradiation: Secondary | ICD-10-CM | POA: Diagnosis not present

## 2023-08-03 MED ORDER — HEPARIN SOD (PORK) LOCK FLUSH 100 UNIT/ML IV SOLN
500.0000 [IU] | Freq: Once | INTRAVENOUS | Status: AC
  Administered 2023-08-03: 500 [IU] via INTRAVENOUS

## 2023-08-03 MED ORDER — SODIUM CHLORIDE 0.9% FLUSH
10.0000 mL | INTRAVENOUS | Status: DC | PRN
  Administered 2023-08-03: 10 mL via INTRAVENOUS

## 2023-08-03 NOTE — Patient Instructions (Signed)

## 2023-08-03 NOTE — Progress Notes (Signed)
 [facility] CONSULT NOTE  Patient Care Team: Associates, [facility] as PCP - General (Family Medicine) Pershing Proud, RN as Oncology Nurse Navigator Donnelly Angelica, RN as Oncology Nurse Navigator Rachel Moulds, MD as Consulting Physician (Hematology and Oncology) Lonie Peak, MD as Attending Physician (Radiation Oncology)  CHIEF COMPLAINTS/PURPOSE OF CONSULTATION:  IDC right breast, ER positive  ASSESSMENT & PLAN:  Breast cancer of upper-outer quadrant of right female breast [facility]) This is a very pleasant 43 year old premenopausal female patient initially diagnosed with invasive ductal carcinoma, grade 2 of the right breast back in May 2024 treated at [facility] transferring her care to [facility] health.  Right Breast Cancer Diagnosed in May 2024 with a 4.4 cm tumor and 5/9 lymph nodes positive. No extracapsular extension. Completed neoadjuvant chemotherapy with Adriamycin, cyclophosphamide, and paclitaxel with residual disease RCB III at surgery. Strongly estrogen and progesterone receptor positive. No further chemotherapy recommended at this time. -She is now s/p adjuvant radiation. High risk of recurrence due to lymph node involvement. Hesitant about antiestrogen therapy due to past adverse experiences.  Once again discussed at least single agent tamoxifen vs verzenio with anastrozole and OFS. She is very reluctant. She understands the benefits and risk of not taking anti estrogen therapy. -I have clearly explained to her that she is a high risk for breast cancer recurrence given large tumor and multiple pos LN. - Provide information on MRD testing for minimal residual disease detection. She will look into it. - She wants to continue surveillance alone, she wants to RTC in 6 months -Mammogram alternating with MRI recommended.  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 No immediate plans for bilateral mastectomy.  -We once again  discussed about intensive screening if she is not to undergo bilateral mastectomy. Referred her to gyn onc for ovarian cancer screening    Orders Placed This Encounter  Procedures   Ambulatory referral to Gynecologic Oncology    Referral Priority:   Routine    Referral Type:   Consultation    Referral Reason:   Specialty Services Required    Number of Visits Requested:   1     HISTORY OF PRESENTING ILLNESS:  Terry Morgan 43 y.o. female is here because of new diagnosis of breast cancer.  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, grade 2, ER 100%, PR 100%, HER2 1+ 09/20/2022 genetic testing: 34 Ambry gene panel positive: pathogenic BRCA2 mutation:   ONCOLOGY TREATMENT HISTORY: 08/24/2022 screening mammogram = right superior breast mass and abnormal axillary lymph node  09/03/2022 additional view mammogram + right breast ultrasound = large irregular mass in the superior right breast with abnormal axillary lymph nodes 09/10/2022 biopsies: Right breast = invasive ductal carcinoma, grade 2, ER 100%, PR 100%, HER2 1+ Right axillary lymph node = metastatic mammary carcinoma 09/23/2022 CT CAP = infiltrating right breast mass measuring 4.0 x 3.9 cm, right axillary adenopathy, hepatomegaly, hepatic steatosis, no evidence of metastatic disease 09/23/2022 bone scan = mild increase in radiotracer uptake in the posterior aspect of S1 possibly degenerative in nature but attention to follow-up is recommended 10/19/2022 started neoadjuvant chemotherapy dose dense AC followed by weekly Taxol  04/15/2023, she had right breast lumpectomy and this showed invasive residual adenocarcinoma 44 mm grade 1, extensive DCIS, margins negative, anterior and medial margin less than 1 mm, final pathologic staging pT2 N2a RCB 3, 5 out of 9 lymph nodes with metastatic carcinoma, no evidence of extracapsular extension.  Given the poor response with neoadjuvant chemotherapy, she wanted to  seek a second opinion about adjuvant care and hence referred to [facility] health.  She now completed adj radiation. We recommended anti estrogen therapy with OFS and verzenio.  Discussed the use of AI scribe software for clinical note transcription with the patient, who gave verbal consent to proceed.  History of Present Illness    Discussed the use of AI scribe software for clinical note transcription with the patient, who gave verbal consent to proceed.  History of Present Illness Terry Morgan is a 43 year old female with breast cancer who presents for follow-up after completing radiation therapy.  She completed radiation therapy on July 26, 2023, and describes the experience as 'horrible,' particularly towards the end. Her skin was very raw but has since improved, with the skin now dried up. However, the nipple area remains irritated and very sensitive.  She is not wanting to take any anti estrogen therapy. She understands the benefits of anti estrogen therapy to reduce the risk of recurrence of breast cancer outside the breasts. She experienced significant side effects from previous chemotherapy with Taxol, including hot flashes, skin blistering, and acne, which have influenced her decision to avoid further medication at this time.  She is aware of her BRCA2 gene mutation and has undergone genetic testing. She has not opted for ovarian surgery and plans to have regular gynecological follow-ups every three to six months, including blood tests for tumor markers and ultrasounds to monitor her ovaries. She requests a referral to our gyn onc.  She is considering the removal of her port in a few weeks after her skin heals from radiation therapy with the Technical sales engineer.  She is also not sure about the MRD testing for detection of breast cancer recurrence.   MEDICAL HISTORY:  Past Medical History:  Diagnosis Date   Blood transfusion without reported diagnosis    CAD (coronary artery disease)     GERD (gastroesophageal reflux disease)    Menorrhagia    Nausea    NSTEMI (non-ST elevated myocardial infarction) (HCC)    a. NSTEMI 08/2014 due to acute occlusion of the RCA s/p aspiration thrombectomy and overlapping DES - EF 50-55%.   Obesity    Thrombocytosis 09/11/2014   Tobacco abuse     SURGICAL HISTORY: Past Surgical History:  Procedure Laterality Date   CARDIAC CATHETERIZATION N/A 09/12/2014   Procedure: Left Heart Cath and Coronary Angiography;  Surgeon: Tonny Bollman, MD;  [facility] INVASIVE CV LAB;  Service: Cardiovascular;  Laterality: N/A;   CARDIAC CATHETERIZATION N/A 09/12/2014   Procedure: Coronary Stent Intervention;  Surgeon: Tonny Bollman, MD;  Location: [facility] INVASIVE CV LAB;  Service: Cardiovascular;  Laterality: N/A;  aspiration thrombectomy   CESAREAN SECTION      SOCIAL HISTORY: Social History   Socioeconomic History   Marital status: Single    Spouse name: Not on file   Number of children: Not on file   Years of education: Not on file   Highest education level: Not on file  Occupational History   Not on file  Tobacco Use   Smoking status: Former    Current packs/day: 0.00    Types: Cigarettes   Smokeless tobacco: Never  Substance and Sexual Activity   Alcohol use: No    Alcohol/week: 0.0 standard drinks of alcohol   Drug use: No   Sexual activity: Not Currently  Other Topics Concern   Not on file  Social History Narrative  Not on file   Social Drivers of Health   Financial Resource Strain: High Risk (06/30/2023)   Overall Financial Resource Strain (CARDIA)    Difficulty of Paying Living Expenses: Very hard  Food Insecurity: Food Insecurity Present (07/15/2023)   Hunger Vital Sign    Worried About Running Out of Food in the Last Year: Sometimes true    Ran Out of Food in the Last Year: Sometimes true  Transportation Needs: No Transportation Needs (06/30/2023)   PRAPARE - Administrator, Civil Service (Medical): No    Lack  of Transportation (Non-Medical): No  Physical Activity: Insufficiently Active (10/27/2022)   Received from [facility]   Exercise Vital Sign    Days of Exercise per Week: 2 days    Minutes of Exercise per Session: 20 min  Stress: No Stress Concern Present (04/15/2023)   Received from [facility] - Occupational Stress Questionnaire    Feeling of Stress : Not at all  Social Connections: Socially Integrated (10/27/2022)   Received from [facility]   Social Network    How would you rate your social network (family, work, friends)?: Good participation with social networks  Intimate Partner Violence: Not At Risk (04/15/2023)   Received from [facility]   HITS    Over the last 12 months how often did your partner physically hurt you?: Never    Over the last 12 months how often did your partner insult you or talk down to you?: Never    Over the last 12 months how often did your partner threaten you with physical harm?: Never    Over the last 12 months how often did your partner scream or curse at you?: Never    FAMILY HISTORY: Family History  Problem Relation Age of Onset   Hypertension Mother    Hyperlipidemia Father    Prostate cancer Father    Stroke Sister 74   Healthy Sister    Healthy Sister    Healthy Brother    Healthy Brother    Healthy Brother    Healthy Brother    Healthy Brother    Asthma Brother    Asthma Brother    Heart disease Paternal Grandmother    Heart murmur Cousin     ALLERGIES:  is allergic to amoxicillin and hydrocortisone.  MEDICATIONS:  Current Outpatient Medications  Medication Sig Dispense Refill   atorvastatin (LIPITOR) 10 MG tablet Take 10 mg by mouth daily.     azithromycin (ZITHROMAX) 250 MG tablet Take 1 tablet (250 mg total) by mouth daily. Take first 2 tablets together, then 1 every day until finished. 6 tablet 0   canagliflozin (INVOKANA) 300 MG TABS tablet Take 300 mg by mouth daily before  breakfast.     ferrous gluconate (FERGON) 324 MG tablet Take 324 mg by mouth daily with breakfast.     fluticasone (FLONASE) 50 MCG/ACT nasal spray Place 1 spray into both nostrils daily. 16 g 0   RIVAROXABAN (XARELTO) VTE STARTER PACK (15 & 20 MG) Follow package directions: Take one 15mg  tablet by mouth twice a day. On day 22, switch to one 20mg  tablet once a day. Take with food.. 51 each 0   No current facility-administered medications for this visit.   Facility-Administered Medications Ordered in Other Visits  Medication Dose Route Frequency Provider Last Rate Last Admin   sodium chloride flush (NS) 0.9 % injection 10 mL  10 mL Intravenous PRN Emily Forse,  MD   10 mL at 08/03/23 1526     PHYSICAL EXAMINATION: ECOG PERFORMANCE STATUS: 0 - Asymptomatic  Vitals:   08/03/23 1556  BP: 129/86  Pulse: 69  Resp: 16  Temp: 98.2 F (36.8 C)  SpO2: 97%     Filed Weights   08/03/23 1556  Weight: 240 lb 11.2 oz (109.2 kg)     PE deferred in lieu of counseling  LABORATORY DATA:  I have reviewed the data as listed Lab Results  Component Value Date   WBC 4.4 04/01/2023   HGB 13.1 04/01/2023   HCT 41.0 04/01/2023   MCV 83.5 04/01/2023   PLT 199 04/01/2023     Chemistry      Component Value Date/Time   NA 133 (L) 04/01/2023 1035   K 3.6 04/01/2023 1035   CL 102 04/01/2023 1035   CO2 25 04/01/2023 1035   BUN 9 04/01/2023 1035   CREATININE 0.61 04/01/2023 1035   CREATININE 0.79 02/19/2015 0825      Component Value Date/Time   CALCIUM 8.7 (L) 04/01/2023 1035   ALKPHOS 56 02/19/2015 0825   AST 23 02/19/2015 0825   ALT 19 02/19/2015 0825   BILITOT 0.4 02/19/2015 0825       RADIOGRAPHIC STUDIES: I have personally reviewed the radiological images as listed and agreed with the findings in the report. No results found.   All questions were answered. The patient knows to call the clinic with any problems, questions or concerns.  Time spent: 30 min     Terry Arms, MD 08/03/2023 4:26 PM

## 2023-08-03 NOTE — Assessment & Plan Note (Signed)
 This is a very pleasant 43 year old premenopausal female patient initially diagnosed with invasive ductal carcinoma, grade 2 of the right breast back in May 2024 treated at [facility] transferring her care to [facility] health.  Right Breast Cancer Diagnosed in May 2024 with a 4.4 cm tumor and 5/9 lymph nodes positive. No extracapsular extension. Completed neoadjuvant chemotherapy with Adriamycin, cyclophosphamide, and paclitaxel with residual disease RCB III at surgery. Strongly estrogen and progesterone receptor positive. No further chemotherapy recommended at this time. -She is now s/p adjuvant radiation. High risk of recurrence due to lymph node involvement. Hesitant about antiestrogen therapy due to past adverse experiences.  Once again discussed at least single agent tamoxifen vs verzenio with anastrozole and OFS. She is very reluctant. She understands the benefits and risk of not taking anti estrogen therapy. -I have clearly explained to her that she is a high risk for breast cancer recurrence given large tumor and multiple pos LN. - Provide information on MRD testing for minimal residual disease detection. She will look into it. - She wants to continue surveillance alone, she wants to RTC in 6 months -Mammogram alternating with MRI recommended.  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 No immediate plans for bilateral mastectomy.  -We once again discussed about intensive screening if she is not to undergo bilateral mastectomy. Referred her to gyn onc for ovarian cancer screening

## 2023-08-09 ENCOUNTER — Telehealth: Payer: Self-pay | Admitting: *Deleted

## 2023-08-09 NOTE — Telephone Encounter (Signed)
 Last entry---4/21 at 1 pm [facility] for the patient to call the office back. Patient needs to be scheduled for a new patient appt   4/22 at 3:15 pm [facility] for the patient to call the office back. Patient needs to be scheduled for a new patient appt

## 2023-08-10 NOTE — Telephone Encounter (Signed)
 Spoke with the patient. Spoke with the patient regarding the referral to GYN oncology. Patient scheduled as new patient with Dr Gaylin Ke on 5/28 at 2 pm.  Patient given an arrival time of 1:30 pm.  Explained to the patient the the doctor will perform a pelvic exam at this visit. Patient given the policy that only one visitor allowed and that visitor must be over 16 yrs are allowed in the Cancer Center. Patient given the address/phone number for the clinic and that the center offers free valet service. Patient aware that masks required.

## 2023-08-12 ENCOUNTER — Other Ambulatory Visit: Admitting: Licensed Clinical Social Worker

## 2023-08-15 NOTE — Progress Notes (Signed)
  Terry Morgan presents today for follow-up after completing radiation to her right breast on 07/26/2023  Pain: Skin:  ROM:  Lymphedema:  MedOnc F/U:  Other issues of note:   Pt reports Yes No Comments  Tamoxifen []  []    Letrozole []  []    Anastrazole []  []    Mammogram []  Date:  []

## 2023-08-18 ENCOUNTER — Ambulatory Visit
Admission: RE | Admit: 2023-08-18 | Discharge: 2023-08-18 | Disposition: A | Discharge status: Home / Self Care | Source: Ambulatory Visit | Attending: Radiation Oncology | Admitting: Radiation Oncology

## 2023-08-18 DIAGNOSIS — Z170 Estrogen receptor positive status [ER+]: Secondary | ICD-10-CM

## 2023-09-13 ENCOUNTER — Other Ambulatory Visit: Payer: Self-pay

## 2023-09-13 ENCOUNTER — Encounter (HOSPITAL_BASED_OUTPATIENT_CLINIC_OR_DEPARTMENT_OTHER): Payer: Self-pay | Admitting: Emergency Medicine

## 2023-09-13 DIAGNOSIS — Z7901 Long term (current) use of anticoagulants: Secondary | ICD-10-CM | POA: Diagnosis not present

## 2023-09-13 DIAGNOSIS — Z853 Personal history of malignant neoplasm of breast: Secondary | ICD-10-CM | POA: Diagnosis not present

## 2023-09-13 DIAGNOSIS — M542 Cervicalgia: Secondary | ICD-10-CM | POA: Diagnosis present

## 2023-09-13 DIAGNOSIS — S0990XA Unspecified injury of head, initial encounter: Secondary | ICD-10-CM | POA: Diagnosis not present

## 2023-09-13 DIAGNOSIS — Y92410 Unspecified street and highway as the place of occurrence of the external cause: Secondary | ICD-10-CM | POA: Diagnosis not present

## 2023-09-13 DIAGNOSIS — S161XXA Strain of muscle, fascia and tendon at neck level, initial encounter: Secondary | ICD-10-CM | POA: Diagnosis not present

## 2023-09-13 DIAGNOSIS — Z23 Encounter for immunization: Secondary | ICD-10-CM | POA: Insufficient documentation
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
## 2023-09-14 ENCOUNTER — Emergency Department (HOSPITAL_BASED_OUTPATIENT_CLINIC_OR_DEPARTMENT_OTHER)

## 2023-09-14 ENCOUNTER — Inpatient Hospital Stay: Attending: Obstetrics & Gynecology | Admitting: Obstetrics & Gynecology

## 2023-09-14 ENCOUNTER — Emergency Department (HOSPITAL_BASED_OUTPATIENT_CLINIC_OR_DEPARTMENT_OTHER)
Admission: EM | Admit: 2023-09-14 | Discharge: 2023-09-14 | Disposition: A | Discharge status: Home / Self Care | Attending: Emergency Medicine | Admitting: Emergency Medicine

## 2023-09-14 VITALS — BP 134/84 | HR 69 | Temp 98.1°F | Resp 18 | Ht 61.0 in | Wt 242.8 lb

## 2023-09-14 DIAGNOSIS — Z9221 Personal history of antineoplastic chemotherapy: Secondary | ICD-10-CM | POA: Diagnosis not present

## 2023-09-14 DIAGNOSIS — C50411 Malignant neoplasm of upper-outer quadrant of right female breast: Secondary | ICD-10-CM | POA: Diagnosis not present

## 2023-09-14 DIAGNOSIS — Z1502 Genetic susceptibility to malignant neoplasm of ovary: Secondary | ICD-10-CM | POA: Insufficient documentation

## 2023-09-14 DIAGNOSIS — Z148 Genetic carrier of other disease: Secondary | ICD-10-CM | POA: Insufficient documentation

## 2023-09-14 DIAGNOSIS — Z170 Estrogen receptor positive status [ER+]: Secondary | ICD-10-CM

## 2023-09-14 DIAGNOSIS — Z923 Personal history of irradiation: Secondary | ICD-10-CM | POA: Diagnosis not present

## 2023-09-14 DIAGNOSIS — Z1501 Genetic susceptibility to malignant neoplasm of breast: Secondary | ICD-10-CM

## 2023-09-14 DIAGNOSIS — S161XXA Strain of muscle, fascia and tendon at neck level, initial encounter: Secondary | ICD-10-CM

## 2023-09-14 DIAGNOSIS — S0990XA Unspecified injury of head, initial encounter: Secondary | ICD-10-CM

## 2023-09-14 MED ORDER — CYCLOBENZAPRINE HCL 10 MG PO TABS
10.0000 mg | ORAL_TABLET | Freq: Once | ORAL | Status: AC
  Administered 2023-09-14: 10 mg via ORAL
  Filled 2023-09-14: qty 1

## 2023-09-14 MED ORDER — CYCLOBENZAPRINE HCL 10 MG PO TABS: 10.0000 mg | ORAL_TABLET | Freq: Three times a day (TID) | ORAL | 0 refills | Status: AC | PRN

## 2023-09-14 NOTE — Progress Notes (Unsigned)
   Follow Up Note: Gyn-Onc  Terry Morgan 43 y.o. female  CC: BRCA2 carrier    HPI: Discussed the use of AI scribe software for clinical note transcription with the patient, who gave verbal consent to proceed.  History of Present Illness Terry Morgan is a 43 year old female with a history of breast cancer and BRCA2 mutation who presents for discussion of risk-reducing oophorectomy.  She has a history of breast cancer treated with lumpectomy, chemotherapy, and radiation. 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  She experienced menopause during chemotherapy, with menstruation returning for three months post-treatment, but has not had a period this month. Her menopausal symptoms during chemotherapy were severe, characterized by extreme heat and itching. She is currently perimenopausal and concerned about the potential exacerbation of menopausal symptoms following surgical intervention.  She is not on any medications, including tamoxifen or aromatase inhibitors, due to concerns about side effects such as blood clots and potential cancer risk.     Review of Systems  Review of Systems  Constitutional:  Negative for malaise/fatigue and weight loss.  Respiratory:  Negative for shortness of breath and wheezing.   Cardiovascular:  Negative for chest pain and leg swelling.  Gastrointestinal:  Negative for abdominal pain, blood in stool, constipation, nausea and vomiting.  Genitourinary:  Negative for dysuria, frequency, hematuria and urgency.  Musculoskeletal:  Negative for joint pain and myalgias.  Neurological:  Negative for weakness.  Psychiatric/Behavioral:  Negative for depression. The patient does not have insomnia.    Current medications, allergy, social history, past surgical history, past medical history, family history were all reviewed.    Vitals:  Blood pressure 134/84, pulse 69,  temperature 98.1 F (36.7 C), temperature source Tympanic, resp. rate 18, height 5\' 1"  (1.549 m), weight 242 lb 12.8 oz (110.1 kg), last menstrual period 08/01/2023, SpO2 100%.  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 Prefers surveillance over oophorectomy due to menopausal concerns. Informed about menopause induction and symptom management options. - Provide information on laparoscopic oophorectomy and non-hormonal medications for hot flashes.  We discussed the option of complete hysterectomy.   Performance of a risk-reducing hysterectomy along with rrBSO in BRCA carriers is discretionary.  Following rrBSO, few patients require hysterectomy for standard indications (eg, symptomatic fibroids, prolapse); thus, there is no compelling reason to perform concurrent hysterectomy to avoid future surgery. The disadvantage of adding hysterectomy to rrBSO is that it increases the morbidity of the procedure and may require a short hospital stay.She his not a candidate for tamoxifen 2/2 thrombosis risk. - Plan for periodic ultrasounds and CA-125 testing every six months if surgery is deferred.   - Discuss risk-reducing surgery options in three months.  Perimenopausal state Irregular menstruation post-chemotherapy. Concerns about menopausal symptoms if oophorectomy is performed. - Provide information on non-hormonal medications to manage hot flashes.    I personally spent 25 minutes face-to-face and non-face-to-face in the care of this patient, which includes all pre, intra, and post visit time on the date of service.    Abdul Hodgkin, MD
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
## 2023-09-14 NOTE — Patient Instructions (Addendum)
 Return in 3 months  During the reproductive years, the ovaries produce estrogen and progesterone. Estrogen is important for normal menstrual periods and fertility, and it promotes bone strength. Estrogen and progesterone levels fall at the time of menopause (when monthly periods stop); this is what causes symptoms such as hot flashes. "Menopausal hormone therapy" (MHT) is the term used to describe the two hormones (estrogen and progesterone) that are often given as a treatment to relieve menopausal symptoms. For people whose uterus has been removed (ie, who have had a hysterectomy), estrogen alone can be given. For others, both estrogen and progesterone are given. Experts recommend natural progesterone; synthetic forms are also available. Estrogen is the most effective treatment available to relieve bothersome symptoms of menopause. However, some people cannot take estrogen; for example, those with breast cancer. Other people choose not to take hormone therapy. Fortunately, there are some alternatives to hormone therapy to treat menopausal symptoms. Although they may not be as effective as estrogen, they do provide some relief. This article discusses alternatives to MHT. The risks, benefits, and options for hormone therapy are discussed separately. (See "Patient education: Menopausal hormone therapy (Beyond the Basics)".) CONTROLLING HOT FLASHES  Non-estrogen treatments for hot flashes are effective for many people. None work as well as estrogen, but they are better than no treatment. Some people only have mild hot flashes and do not need treatment. For those who do, options include: Antidepressants -- Antidepressant medications are recommended as a first-line treatment for hot flashes in people who cannot take estrogen. ?SSRIs - The selective serotonin receptor inhibitors (SSRIs) are the class of antidepressants used most commonly for treating hot flashes. A form of paroxetine (sample brand name: Jodelle Mungo)  is an SSRI and is the only nonhormonal therapy that is specifically approved for hot flashes in the [place] . This medication has been used for many years for treating depression, but it can be taken at a lower dose for hot flashes. Although not approved for this use, other SSRIs such as citalopram (brand name: Celexa), and escitalopram (brand name: Lexapro) relieve hot flashes to similar degrees as is seen with paroxetine.   People with breast cancer who are taking a drug called tamoxifen should not take paroxetine, as it can interfere with tamoxifen. ?Other antidepressants - Antidepressants that are similar to SSRIs such as venlafaxine (brand name: Effexor) and desvenlafaxine (brand name: Pristiq) also reduce hot flashes, but they may have more side effects than the SSRIs. Fluoxetine (brand name: Prozac) and sertraline (brand name: Zoloft) do not work as well as the other antidepressants listed. More information about antidepressants is available separately. (See "Patient education: Depression treatment options for adults (Beyond the Basics)".) Gabapentin -- Gabapentin (sample brand name: Neurontin) is a medication that was developed to treat seizures. It also relieves hot flashes in some people. It may be taken as a single bedtime dose (if hot flashes are most bothersome at night) or can also be taken during the daytime. Oxybutynin -- Oxybutynin is a drug that is usually used to treat overactive bladder and urinary incontinence. It also has been demonstrated to be effective for treating hot flashes. The most bothersome side effect is dry mouth. Fezolinetant -- Fezolinetant (brand name: Marietta Shorter) is a nonhormonal medication that comes as a daily pill. Possible side effects include abdominal pain, diarrhea, back pain, and liver problems. Progesterone -- The injectable progestin birth control hormone medroxyprogesterone acetate (brand name: Depo-Provera) helps to reduce hot flashes about as well as  estrogen; however, it is  not commonly used due to side effects such as irregular vaginal bleeding, acne, headache, and depression. Plant-derived estrogens (phytoestrogens) -- Plant-derived estrogens have been marketed as a "natural" or "safer" alternative to hormones for relieving menopausal symptoms. Phytoestrogens are found in many foods, including soybeans, chickpeas, lentils, flaxseed, lentils, grains, fruits, vegetables, and red clover. Supplements containing isoflavone, a type of phytoestrogen, can be purchased in health food stores. However, it is uncertain whether phytoestrogens help to reduce hot flashes or night sweats; most studies have not reported benefit. In addition, some phytoestrogens might act like estrogen in some tissues of the body. Many experts suggest that people who have a history of breast cancer avoid phytoestrogens. Herbal treatments -- A number of herbal treatments have been promoted as a "natural" remedy for hot flashes. Many people use black cohosh for hot flashes, but clinical trials have shown that it is not more effective than placebo. There have been concerns that black cohosh could stimulate breast tissue like estrogen, increasing the risk of recurrence in people who have had breast cancer. However, there is no convincing evidence that it is harmful, even in people with breast cancer. Still, some experts suggest that people with breast cancer avoid black cohosh until this has been studied more extensively. Herbal treatments are not recommended for hot flashes or other menopausal symptoms. Mind-body and other treatments -- Stress management, relaxation, deep breathing, and yoga might be helpful for some people, but study results have been inconsistent; however, these approaches are not likely to be harmful and may have other benefits. Other approaches such as hypnosis and acupuncture have also not been proven to reduce hot flashes, although some people find them helpful  (possibly due to a placebo effect). A stellate ganglion blockade (numbing of a nerve in the neck with an injection) has been shown, in a number of small trials, to help hot flashes, but it is rarely done in practice. TREATING VAGINAL DRYNESS  Low-dose vaginal estrogen is a very effective treatment for vaginal dryness or pain with intercourse due to menopause. This is a treatment that can be continued for many years after menopause because only minimal amounts get into the bloodstream. On this basis, low-dose estrogen is not thought to increase the risk of breast cancer, heart attack, or stroke. This, along with other treatment options for vaginal dryness, is discussed in more detail in a separate article  Bilateral Salpingo-Oophorectomy  Bilateral salpingo-oophorectomy is the surgical removal of both fallopian tubes and both ovaries. The ovaries are reproductive organs that produce eggs in females. The fallopian tubes allow eggs to move from the ovaries to the uterus. You may need this procedure if you: Have had your uterus removed. This procedure is usually done after the uterus is removed. Have cancer of the fallopian tubes or ovaries. Have a high risk of cancer of the fallopian tubes or ovaries. There are three techniques that can be used for this procedure: Open. One large incision will be made in your abdomen. Laparoscopic. A thin, lighted tube with a small camera (laparoscope) is used to help perform the procedure. The laparoscope allows your surgeon to make several small incisions in the abdomen instead of one large incision. Robot-assisted. A computer is used to control surgical instruments that are attached to robotic arms. A laparoscope may also be used with this technique. After this procedure, you will not be able to become pregnant (will be sterile), and you will go into menopause. Menopause is when you no longer have menstrual periods.  Symptoms of menopause can include hot flashes, night  sweats, and mood changes. Your sex drive may also be affected. Tell a health care provider about: Any allergies you have. All medicines you are taking, including vitamins, herbs, eye drops, creams, and over-the-counter medicines. Any problems you or family members have had with anesthetic medicines. Any blood disorders you have. Any surgeries you have had. Any medical conditions you have. Whether you are pregnant or may be pregnant. What are the risks? Generally, this is a safe procedure. However, problems may occur, including: Infection. Bleeding. Allergic reactions to medicines. Damage to nearby structures or organs. Blood clots in the legs or lungs. What happens before the procedure? Staying hydrated Follow instructions from your health care provider about hydration, which may include: Up to 2 hours before the procedure - you may continue to drink clear liquids, such as water, clear fruit juice, black coffee, and plain tea.  Eating and drinking restrictions Follow instructions from your health care provider about eating and drinking, which may include: 8 hours before the procedure - stop eating heavy meals or foods, such as meat, fried foods, or fatty foods. 6 hours before the procedure - stop eating light meals or foods, such as toast or cereal. 6 hours before the procedure - stop drinking milk or drinks that contain milk. 2 hours before the procedure - stop drinking clear liquids. Medicines Ask your health care provider about: Changing or stopping your regular medicines. This is especially important if you are taking diabetes medicines or blood thinners. Taking medicines such as aspirin  and ibuprofen . These medicines can thin your blood. Do not take these medicines unless your health care provider tells you to take them. Taking over-the-counter medicines, vitamins, herbs, and supplements. General instructions Do not use any products that contain nicotine or tobacco for at least  4 weeks before the procedure. These products include cigarettes, chewing tobacco, and vaping devices, such as e-cigarettes. If you need help quitting, ask your health care provider. You may have an exam or tests, such as a blood or urine test. Ask your health care provider: How your surgery site will be marked. What steps will be taken to help prevent infection. These steps may include: Removing hair at the surgery site. Washing skin with a germ-killing soap. Taking antibiotic medicine. Plan to have a responsible adult take you home from the hospital or clinic. If you will be going home right after the procedure, plan to have a responsible adult care for you for the time you are told. This is important. What happens during the procedure? An IV will be inserted into one of your veins. You will be given one or both of the following: A medicine to help you relax (sedative). A medicine to make you fall asleep (general anesthetic). A small, thin tube (catheter) will be inserted through your urethra and into your bladder. The catheter drains urine during your procedure. Depending on the type of surgery you are having, one incision or several small incisions will be made in your abdomen. Your fallopian tubes and ovaries will be cut away from the uterus and removed. Your blood vessels will be clamped and tied to prevent too much bleeding. The incision or incisions in your abdomen will be closed with stitches (sutures), staples, or skin glue. A bandage (dressing) may be placed over your incision or incisions. The procedure may vary among health care providers and hospitals. What happens after the procedure? Your blood pressure, heart rate, breathing rate, and blood  oxygen level will be monitored until you leave the hospital or clinic. You may continue to receive fluids and medicines through an IV. You may continue to have a catheter draining your urine. You may have to wear compression stockings.  These stockings help to prevent blood clots and reduce swelling in your legs. You will be given pain medicine as needed. If you were given a sedative during the procedure, it can affect you for several hours. Do not drive or operate machinery until your health care provider says that it is safe. Summary Bilateral salpingo-oophorectomy is a procedure to remove both fallopian tubes and both ovaries. There are three different techniques that can be used for this procedure: open, laparoscopic, and robotic. Talk with your health care provider about how your procedure will be done. After this procedure, you will not be able to become pregnant (will be sterile). You will go into menopause. This is when you no longer have a menstrual period. Plan to have a responsible adult take you home from the hospital or clinic. This information is not intended to replace advice given to you by your health care provider. Make sure you discuss any questions you have with your health care provider. Document Revised: 02/26/2020 Document Reviewed: 02/26/2020 Elsevier Patient Education  2024 [employer]  Surgery to Remove Fallopian Tube and Ovary (Salpingo-Oophorectomy): What to Know After After the surgery to remove fallopian tubes and ovaries, it's common to have pain in your belly and light bleeding from your vagina (spotting). You may also feel tired. If both ovaries and both tubes were removed, you may have symptoms of menopause. Menopause is when you no longer have a period. Symptoms of menopause include: Hot flashes. Night sweats. Mood swings. Follow these instructions at home: Medicines Take your medicines only as told. If you were given antibiotics, take them as told. Do not stop taking them even if you start to feel better. You may need to take steps to help treat or prevent trouble pooping (constipation), such as: Taking medicines to help you poop. Eating foods high in fiber, like beans, whole grains,  and fresh fruits and vegetables. Drinking more fluids as told. Ask your provider if it's safe to drive or use machines while taking your medicine. Incision care  Take care of your cut from surgery as told. Make sure you: Wash your hands with soap and water for at least 20 seconds before and after you change your bandage. If you can't use soap and water, use hand sanitizer. Change your bandage. Leave stitches, staples, or skin glue alone. Leave tape strips alone unless you're told to take them off. You may trim the edges of the tape strips if they curl up. Check the area around your cut from surgery every day for signs of infection. Check for: Redness, swelling, or pain. Fluid or blood. Warmth. Pus or a bad smell. Activity Rest as told. Get up to take short walks at least every 2 hours during the day. This helps you breathe better and keeps your blood flowing. Ask for help if you feel weak or unsteady. Exercise as told. Do not lift anything heavier than 10 lb (4.5 kg) until you're told it's OK. Ask what things are safe for you to do at home. Ask when you can go back to work or school. General instructions Do not take baths, swim, or use a hot tub until you're told it's OK. Ask if you can shower. Wear compression stockings to reduce swelling and help  prevent blood clots in your legs. Contact a health care provider if: You have pain when you pee. You have signs of infection in the cut made for surgery. You have a fever. You have a rash. You have belly pain that gets worse or does not get better with medicine. You feel light-headed. You throw up or feel like throwing up. Get help right away if: You have very bad pain in your chest or leg. You have shortness of breath. You faint. You have more bleeding or heavy bleeding from your vagina. This is bleeding that soaks 1 pad in an hour. These symptoms may be an emergency. Call 911 right away. Do not wait to see if the symptoms will go  away. Do not drive yourself to the hospital. This information is not intended to replace advice given to you by your health care provider. Make sure you discuss any questions you have with your health care provider. Document Revised: 11/29/2022 Document Reviewed: 11/22/2022 Elsevier Patient Education  2024 [employer].

## 2023-09-14 NOTE — Discharge Instructions (Signed)
 Begin taking ibuprofen  600 mg every 6 hours as needed for pain.  Begin taking Flexeril as needed for pain not relieved with ibuprofen .  Follow-up with primary doctor if not improving in the next 1 to 2 weeks.

## 2023-09-15 ENCOUNTER — Encounter: Payer: Self-pay | Admitting: Obstetrics & Gynecology

## 2023-10-27 ENCOUNTER — Telehealth: Payer: Self-pay | Admitting: *Deleted

## 2023-10-27 ENCOUNTER — Inpatient Hospital Stay: Attending: Obstetrics & Gynecology

## 2023-10-27 ENCOUNTER — Inpatient Hospital Stay

## 2023-10-27 ENCOUNTER — Other Ambulatory Visit: Payer: Self-pay

## 2023-10-27 DIAGNOSIS — Z452 Encounter for adjustment and management of vascular access device: Secondary | ICD-10-CM | POA: Insufficient documentation

## 2023-10-27 DIAGNOSIS — C50911 Malignant neoplasm of unspecified site of right female breast: Secondary | ICD-10-CM | POA: Diagnosis present

## 2023-10-27 DIAGNOSIS — Z170 Estrogen receptor positive status [ER+]: Secondary | ICD-10-CM | POA: Diagnosis not present

## 2023-10-27 DIAGNOSIS — Z1721 Progesterone receptor positive status: Secondary | ICD-10-CM | POA: Diagnosis not present

## 2023-10-27 DIAGNOSIS — Z1732 Human epidermal growth factor receptor 2 negative status: Secondary | ICD-10-CM | POA: Diagnosis not present

## 2023-10-27 DIAGNOSIS — Z95828 Presence of other vascular implants and grafts: Secondary | ICD-10-CM | POA: Insufficient documentation

## 2023-10-27 MED ORDER — HEPARIN SOD (PORK) LOCK FLUSH 100 UNIT/ML IV SOLN
500.0000 [IU] | Freq: Once | INTRAVENOUS | Status: AC
  Administered 2023-10-27: 500 [IU]

## 2023-10-27 MED ORDER — SODIUM CHLORIDE 0.9% FLUSH
10.0000 mL | Freq: Once | INTRAVENOUS | Status: AC
  Administered 2023-10-27: 10 mL

## 2023-10-27 NOTE — Telephone Encounter (Signed)
 Scheduler came to see this nurse regarding Terry Morgan's Disability.  Patient asking dates on form, experiencing problems.   [employer] form for leave reads end date of 10/17/2023.  Will need to resubmit request if further leave/disability needed through employer or [employer].  Provided above information to scheduler for patient.

## 2023-11-09 ENCOUNTER — Ambulatory Visit: Attending: Physician Assistant | Admitting: Rehabilitation

## 2023-11-09 ENCOUNTER — Encounter: Payer: Self-pay | Admitting: Rehabilitation

## 2023-11-09 ENCOUNTER — Other Ambulatory Visit: Payer: Self-pay

## 2023-11-09 DIAGNOSIS — N644 Mastodynia: Secondary | ICD-10-CM | POA: Diagnosis present

## 2023-11-09 DIAGNOSIS — I890 Lymphedema, not elsewhere classified: Secondary | ICD-10-CM | POA: Insufficient documentation

## 2023-11-09 DIAGNOSIS — C50411 Malignant neoplasm of upper-outer quadrant of right female breast: Secondary | ICD-10-CM | POA: Diagnosis present

## 2023-11-09 DIAGNOSIS — Z170 Estrogen receptor positive status [ER+]: Secondary | ICD-10-CM | POA: Insufficient documentation
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
## 2023-11-14 ENCOUNTER — Encounter: Payer: Self-pay | Admitting: Rehabilitation

## 2023-11-14 ENCOUNTER — Ambulatory Visit: Admitting: Rehabilitation

## 2023-11-14 DIAGNOSIS — I890 Lymphedema, not elsewhere classified: Secondary | ICD-10-CM

## 2023-11-14 DIAGNOSIS — N644 Mastodynia: Secondary | ICD-10-CM

## 2023-11-14 DIAGNOSIS — C50411 Malignant neoplasm of upper-outer quadrant of right female breast: Secondary | ICD-10-CM | POA: Diagnosis not present

## 2023-11-14 DIAGNOSIS — Z170 Estrogen receptor positive status [ER+]: Secondary | ICD-10-CM
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
## 2023-11-16 ENCOUNTER — Ambulatory Visit: Admitting: Rehabilitation

## 2023-11-16 ENCOUNTER — Encounter: Payer: Self-pay | Admitting: Rehabilitation

## 2023-11-16 DIAGNOSIS — I890 Lymphedema, not elsewhere classified: Secondary | ICD-10-CM

## 2023-11-16 DIAGNOSIS — N644 Mastodynia: Secondary | ICD-10-CM

## 2023-11-16 DIAGNOSIS — C50411 Malignant neoplasm of upper-outer quadrant of right female breast: Secondary | ICD-10-CM | POA: Diagnosis not present

## 2023-11-16 DIAGNOSIS — Z170 Estrogen receptor positive status [ER+]: Secondary | ICD-10-CM
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
## 2023-11-17 ENCOUNTER — Inpatient Hospital Stay (HOSPITAL_BASED_OUTPATIENT_CLINIC_OR_DEPARTMENT_OTHER): Admitting: Adult Health

## 2023-11-17 VITALS — BP 130/79 | HR 61 | Temp 98.1°F | Resp 18 | Ht 61.0 in | Wt 251.0 lb

## 2023-11-17 DIAGNOSIS — Z452 Encounter for adjustment and management of vascular access device: Secondary | ICD-10-CM | POA: Diagnosis not present

## 2023-11-17 DIAGNOSIS — C50411 Malignant neoplasm of upper-outer quadrant of right female breast: Secondary | ICD-10-CM

## 2023-11-17 DIAGNOSIS — Z170 Estrogen receptor positive status [ER+]: Secondary | ICD-10-CM
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
## 2023-11-18 ENCOUNTER — Encounter: Payer: Self-pay | Admitting: Hematology and Oncology

## 2023-11-22 ENCOUNTER — Telehealth: Payer: Self-pay | Admitting: Physical Therapy

## 2023-11-22 ENCOUNTER — Ambulatory Visit: Attending: Physician Assistant | Admitting: Physical Therapy

## 2023-11-22 DIAGNOSIS — C50411 Malignant neoplasm of upper-outer quadrant of right female breast: Secondary | ICD-10-CM | POA: Insufficient documentation

## 2023-11-22 DIAGNOSIS — N644 Mastodynia: Secondary | ICD-10-CM | POA: Insufficient documentation

## 2023-11-22 DIAGNOSIS — I890 Lymphedema, not elsewhere classified: Secondary | ICD-10-CM | POA: Insufficient documentation

## 2023-11-22 DIAGNOSIS — Z170 Estrogen receptor positive status [ER+]: Secondary | ICD-10-CM | POA: Insufficient documentation

## 2023-11-22 NOTE — Telephone Encounter (Signed)
 Called pt because she missed her 3pm PT appointment. Pt reports she set her alarm and overslept. Pt confirmed Thurs appointment. Florina Sever Benjamin,  11/22/23 3:29 PM

## 2023-11-24 ENCOUNTER — Ambulatory Visit: Admitting: Rehabilitation

## 2023-11-25 ENCOUNTER — Telehealth: Payer: Self-pay | Admitting: Rehabilitation

## 2023-11-25 NOTE — Telephone Encounter (Signed)
 Called pt about missed PT appointment 11/24/23.  Pt reports she will be at her next appointment 11/28/23 at 4pm.  Another no-show will result in having to schedule remaining appointments 1 at a time.

## 2023-11-28 ENCOUNTER — Ambulatory Visit

## 2023-11-28 DIAGNOSIS — Z170 Estrogen receptor positive status [ER+]: Secondary | ICD-10-CM | POA: Diagnosis present

## 2023-11-28 DIAGNOSIS — N644 Mastodynia: Secondary | ICD-10-CM

## 2023-11-28 DIAGNOSIS — C50411 Malignant neoplasm of upper-outer quadrant of right female breast: Secondary | ICD-10-CM | POA: Diagnosis present

## 2023-11-28 DIAGNOSIS — I890 Lymphedema, not elsewhere classified: Secondary | ICD-10-CM

## 2023-11-28 NOTE — Patient Instructions (Signed)
 SHOULDER: Flexion - Supine (Cane)        Cancer Rehab [id number]    Hold cane in both hands. Raise arms up overhead. Do not allow back to arch. Hold _5__ seconds. Do __5__ times; __2__ times a day.   Hands shoulder width 2 Hands wider than shoulder width; V position
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
## 2023-12-05 ENCOUNTER — Ambulatory Visit

## 2023-12-05 DIAGNOSIS — N644 Mastodynia: Secondary | ICD-10-CM | POA: Diagnosis not present

## 2023-12-05 DIAGNOSIS — I890 Lymphedema, not elsewhere classified: Secondary | ICD-10-CM

## 2023-12-05 DIAGNOSIS — Z170 Estrogen receptor positive status [ER+]: Secondary | ICD-10-CM
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
## 2023-12-07 ENCOUNTER — Encounter: Payer: Self-pay | Admitting: Rehabilitation

## 2023-12-07 ENCOUNTER — Ambulatory Visit: Admitting: Rehabilitation

## 2023-12-07 DIAGNOSIS — I890 Lymphedema, not elsewhere classified: Secondary | ICD-10-CM

## 2023-12-07 DIAGNOSIS — N644 Mastodynia: Secondary | ICD-10-CM

## 2023-12-07 DIAGNOSIS — Z170 Estrogen receptor positive status [ER+]: Secondary | ICD-10-CM
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
## 2023-12-12 ENCOUNTER — Encounter: Payer: Self-pay | Admitting: Rehabilitation

## 2023-12-12 ENCOUNTER — Ambulatory Visit: Admitting: Rehabilitation

## 2023-12-12 DIAGNOSIS — Z170 Estrogen receptor positive status [ER+]: Secondary | ICD-10-CM

## 2023-12-12 DIAGNOSIS — I890 Lymphedema, not elsewhere classified: Secondary | ICD-10-CM

## 2023-12-12 DIAGNOSIS — N644 Mastodynia: Secondary | ICD-10-CM | POA: Diagnosis not present
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
## 2023-12-14 ENCOUNTER — Inpatient Hospital Stay: Attending: Obstetrics & Gynecology | Admitting: Obstetrics & Gynecology

## 2023-12-14 DIAGNOSIS — Z170 Estrogen receptor positive status [ER+]: Secondary | ICD-10-CM

## 2023-12-14 NOTE — Progress Notes (Unsigned)
   Follow Up Note: Gyn-Onc  Terry Morgan 43 y.o. female  CC: BRCA2 carrier    HPI: Discussed the use of AI scribe software for clinical note transcription with the patient, who gave verbal consent to proceed.  History of Present Illness Terry Morgan is a 43 year old female with a history of breast cancer and BRCA2 mutation who presents for discussion of risk-reducing oophorectomy.  She has a history of breast cancer treated with lumpectomy, chemotherapy, and radiation. 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  She experienced menopause during chemotherapy, with menstruation returning for three months post-treatment, but has not had a period this month. Her menopausal symptoms during chemotherapy were severe, characterized by extreme heat and itching. She is currently perimenopausal and concerned about the potential exacerbation of menopausal symptoms following surgical intervention.  She is not on any medications, including tamoxifen or aromatase inhibitors, due to concerns about side effects such as blood clots and potential cancer risk.     Review of Systems  Review of Systems  Constitutional:  Negative for malaise/fatigue and weight loss.  Respiratory:  Negative for shortness of breath and wheezing.   Cardiovascular:  Negative for chest pain and leg swelling.  Gastrointestinal:  Negative for abdominal pain, blood in stool, constipation, nausea and vomiting.  Genitourinary:  Negative for dysuria, frequency, hematuria and urgency.  Musculoskeletal:  Negative for joint pain and myalgias.  Neurological:  Negative for weakness.  Psychiatric/Behavioral:  Negative for depression. The patient does not have insomnia.    Current medications, allergy, social history, past surgical history, past medical history, family history were all reviewed.    Vitals:  There were no vitals taken for this  visit.  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 Prefers surveillance over oophorectomy due to menopausal concerns. Informed about menopause induction and symptom management options. - Provide information on laparoscopic oophorectomy and non-hormonal medications for hot flashes.  We discussed the option of complete hysterectomy.   Performance of a risk-reducing hysterectomy along with rrBSO in BRCA carriers is discretionary.  Following rrBSO, few patients require hysterectomy for standard indications (eg, symptomatic fibroids, prolapse); thus, there is no compelling reason to perform concurrent hysterectomy to avoid future surgery. The disadvantage of adding hysterectomy to rrBSO is that it increases the morbidity of the procedure and may require a short hospital stay.She his not a candidate for tamoxifen 2/2 thrombosis risk. - Plan for periodic ultrasounds and CA-125 testing every six months if surgery is deferred.   - Discuss risk-reducing surgery options in three months.  Perimenopausal state Irregular menstruation post-chemotherapy. Concerns about menopausal symptoms if oophorectomy is performed. - Provide information on non-hormonal medications to manage hot flashes.    I personally spent 25 minutes face-to-face and non-face-to-face in the care of this patient, which includes all pre, intra, and post visit time on the date of service.    [place], MD

## 2023-12-15 ENCOUNTER — Ambulatory Visit: Admitting: Physical Therapy

## 2023-12-15 ENCOUNTER — Encounter: Payer: Self-pay | Admitting: Physical Therapy

## 2023-12-15 DIAGNOSIS — Z170 Estrogen receptor positive status [ER+]: Secondary | ICD-10-CM

## 2023-12-15 DIAGNOSIS — N644 Mastodynia: Secondary | ICD-10-CM

## 2023-12-15 DIAGNOSIS — I890 Lymphedema, not elsewhere classified: Secondary | ICD-10-CM
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
## 2023-12-28 ENCOUNTER — Ambulatory Visit: Attending: Physician Assistant

## 2023-12-28 ENCOUNTER — Inpatient Hospital Stay: Attending: Obstetrics & Gynecology | Admitting: Hematology and Oncology

## 2023-12-28 DIAGNOSIS — Z170 Estrogen receptor positive status [ER+]: Secondary | ICD-10-CM | POA: Insufficient documentation

## 2023-12-28 DIAGNOSIS — I890 Lymphedema, not elsewhere classified: Secondary | ICD-10-CM | POA: Diagnosis present

## 2023-12-28 DIAGNOSIS — C50411 Malignant neoplasm of upper-outer quadrant of right female breast: Secondary | ICD-10-CM | POA: Diagnosis present

## 2023-12-28 DIAGNOSIS — N644 Mastodynia: Secondary | ICD-10-CM | POA: Insufficient documentation
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
## 2023-12-29 NOTE — Addendum Note (Signed)
 Addended by: LANIS CARBON, FLORINA L on: 12/29/2023 05:00 PM   Modules accepted: Orders

## 2024-01-02 ENCOUNTER — Encounter: Payer: Self-pay | Admitting: Rehabilitation

## 2024-01-02 ENCOUNTER — Ambulatory Visit: Admitting: Rehabilitation

## 2024-01-02 DIAGNOSIS — N644 Mastodynia: Secondary | ICD-10-CM

## 2024-01-02 DIAGNOSIS — Z170 Estrogen receptor positive status [ER+]: Secondary | ICD-10-CM

## 2024-01-02 DIAGNOSIS — I890 Lymphedema, not elsewhere classified: Secondary | ICD-10-CM
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
## 2024-01-04 ENCOUNTER — Ambulatory Visit

## 2024-01-04 DIAGNOSIS — Z170 Estrogen receptor positive status [ER+]: Secondary | ICD-10-CM

## 2024-01-04 DIAGNOSIS — N644 Mastodynia: Secondary | ICD-10-CM | POA: Diagnosis not present

## 2024-01-04 DIAGNOSIS — I890 Lymphedema, not elsewhere classified: Secondary | ICD-10-CM
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
## 2024-01-23 ENCOUNTER — Encounter: Payer: Self-pay | Admitting: Hematology and Oncology

## 2024-01-25 ENCOUNTER — Ambulatory Visit (HOSPITAL_COMMUNITY): Admission: RE | Admit: 2024-01-25 | Source: Ambulatory Visit

## 2024-02-06 ENCOUNTER — Telehealth: Payer: Self-pay

## 2024-02-06 NOTE — Telephone Encounter (Signed)
 Sent to nurse Rick, RN) to have the appointment cancelled .Terry Morgan Patient stated she has a new job and can't get the appointment in right now.. will have Kim to contact scheduling to have them reschedule her.Terry Morgan

## 2024-02-07 ENCOUNTER — Inpatient Hospital Stay: Admitting: Hematology and Oncology

## 2024-02-07 ENCOUNTER — Telehealth: Payer: Self-pay | Admitting: Hematology and Oncology

## 2024-02-07 NOTE — Telephone Encounter (Signed)
 left vm for patient to call back and reschedule missed appt.

## 2024-05-01 NOTE — Addendum Note (Signed)
 Encounter addended by: Zai Chmiel L on: 05/01/2024 10:04 AM  Actions taken: Imaging Exam ended
